# Patient Record
Sex: Male | Born: 1960 | Hispanic: No | Marital: Single | Smoking: Current every day smoker
Health system: Southern US, Community
[De-identification: ages and names within clinical notes are randomized; demographics above are authoritative.]

## PROBLEM LIST (undated history)

## (undated) DIAGNOSIS — F419 Anxiety disorder, unspecified: Secondary | ICD-10-CM

## (undated) DIAGNOSIS — R569 Unspecified convulsions: Secondary | ICD-10-CM

## (undated) DIAGNOSIS — E785 Hyperlipidemia, unspecified: Secondary | ICD-10-CM

## (undated) DIAGNOSIS — T7840XA Allergy, unspecified, initial encounter: Secondary | ICD-10-CM

## (undated) DIAGNOSIS — F32A Depression, unspecified: Secondary | ICD-10-CM

## (undated) HISTORY — DX: Hyperlipidemia, unspecified: E78.5

## (undated) HISTORY — DX: Allergy, unspecified, initial encounter: T78.40XA

## (undated) HISTORY — PX: HERNIA REPAIR: SHX51

## (undated) HISTORY — PX: OTHER SURGICAL HISTORY: SHX169

## (undated) HISTORY — DX: Unspecified convulsions: R56.9

## (undated) HISTORY — DX: Depression, unspecified: F32.A

## (undated) HISTORY — DX: Anxiety disorder, unspecified: F41.9

---

## 2017-10-05 LAB — HM HEPATITIS C SCREENING LAB: HM Hepatitis Screen: NEGATIVE

## 2017-12-25 LAB — HM COLONOSCOPY

## 2019-05-21 DIAGNOSIS — G4700 Insomnia, unspecified: Secondary | ICD-10-CM | POA: Insufficient documentation

## 2020-08-07 HISTORY — PX: CAROTID ENDARTERECTOMY: SUR193

## 2020-08-23 DIAGNOSIS — R29810 Facial weakness: Secondary | ICD-10-CM | POA: Insufficient documentation

## 2020-11-05 DIAGNOSIS — M5441 Lumbago with sciatica, right side: Secondary | ICD-10-CM | POA: Diagnosis not present

## 2021-06-28 DIAGNOSIS — J0140 Acute pansinusitis, unspecified: Secondary | ICD-10-CM | POA: Diagnosis not present

## 2021-12-23 ENCOUNTER — Ambulatory Visit: Payer: BC Managed Care – PPO | Admitting: Nurse Practitioner

## 2021-12-23 ENCOUNTER — Encounter: Payer: Self-pay | Admitting: Nurse Practitioner

## 2021-12-23 VITALS — BP 100/78 | HR 90 | Temp 97.6°F | Ht 69.5 in | Wt 179.2 lb

## 2021-12-23 DIAGNOSIS — Z125 Encounter for screening for malignant neoplasm of prostate: Secondary | ICD-10-CM

## 2021-12-23 DIAGNOSIS — Z0000 Encounter for general adult medical examination without abnormal findings: Secondary | ICD-10-CM | POA: Insufficient documentation

## 2021-12-23 DIAGNOSIS — E785 Hyperlipidemia, unspecified: Secondary | ICD-10-CM | POA: Diagnosis not present

## 2021-12-23 DIAGNOSIS — E559 Vitamin D deficiency, unspecified: Secondary | ICD-10-CM | POA: Insufficient documentation

## 2021-12-23 DIAGNOSIS — R5383 Other fatigue: Secondary | ICD-10-CM

## 2021-12-23 DIAGNOSIS — E663 Overweight: Secondary | ICD-10-CM

## 2021-12-23 DIAGNOSIS — F419 Anxiety disorder, unspecified: Secondary | ICD-10-CM | POA: Diagnosis not present

## 2021-12-23 DIAGNOSIS — I6522 Occlusion and stenosis of left carotid artery: Secondary | ICD-10-CM

## 2021-12-23 DIAGNOSIS — G250 Essential tremor: Secondary | ICD-10-CM | POA: Insufficient documentation

## 2021-12-23 DIAGNOSIS — G4700 Insomnia, unspecified: Secondary | ICD-10-CM | POA: Diagnosis not present

## 2021-12-23 DIAGNOSIS — C44321 Squamous cell carcinoma of skin of nose: Secondary | ICD-10-CM | POA: Insufficient documentation

## 2021-12-23 DIAGNOSIS — F32A Depression, unspecified: Secondary | ICD-10-CM

## 2021-12-23 NOTE — Assessment & Plan Note (Signed)
Patient states history of fluctuating vitamin D levels and vitamin D deficiency.  Pending lab result

## 2021-12-23 NOTE — Assessment & Plan Note (Signed)
Patient states he was on Lunesta 3 mg.  States he has not slept as well since being off of the medication.  States he was started on Lunesta 2 mg and titrated to 3 mg patient is interested in getting back on medication.  Pending lab results we will attempt to look up on PDMP

## 2021-12-23 NOTE — Assessment & Plan Note (Signed)
Patient has a history of left carotid endarterectomy for 80% plus blockage.

## 2021-12-23 NOTE — Assessment & Plan Note (Signed)
Ambiguous in nature.  We will check a battery labs patient does not have carotid bruits on exam

## 2021-12-23 NOTE — Assessment & Plan Note (Signed)
Unable to review electronic medical records as patient was out of state medical record release signed in office today patient used to see Dr. Alfonso Ramus in [place]

## 2021-12-23 NOTE — Assessment & Plan Note (Signed)
Used to be on atorvastatin 40 mg.  Has been out of medication for approximately 7 months.  Pending labs we will restart atorvastatin 40 mg

## 2021-12-23 NOTE — Assessment & Plan Note (Signed)
Patient used to be maintained on escitalopram 20 mg along with bupropion 150 mg.  Patient has not been taking medication for approximately 7 months.  Did administer PHQ-9 and GAD-7 in office.  Screenings were positive patient denies SI/HI/AVH.  He would like to hold off on taking any medication at the current moment as he feels like he is doing well since being off.

## 2021-12-23 NOTE — Patient Instructions (Signed)
Nice to see you today I will be in touch with the lab results once I have them Follow up in 3 months, sooner if you need me

## 2021-12-23 NOTE — Progress Notes (Signed)
Established Patient Office Visit  Subjective   Patient ID: Barry Perez, male    DOB: November 27, 1960  Age: 61 y.o. MRN: [id number]  Chief Complaint  Patient presents with   Establish Care    Loss of energy, weakness in body-exhaustion in afternoon. Shaky knees Carotid artery, possible anemia Has been out of meds for at least 7 months, has not been able to get them refilled, as has not been able to be seen     HPI  Fatigue:  past month or 6 weeks. States that most days and 12-2 daily he will feel weak and his legs feel week and feel sluggish. No pain. No passing out thus.  Left sided carotid en 08/2020  HLD: Tolerated atorvastatin and did well.  1-2 meals throughout the day. Will snack on beef jerky. Coffee (3-5 cups), water on occasionaly. Black coffee to 1`-2 16oz cokes  Allergies: has been on zyrtec and singular  Insomnia: States that he has been on lunesta '3mg'$ . Has been off medicatoin for approx 7 months. States that he has trouble getting to sleep but can stay asleep  Anxiety and depression: was on lexapro and welbutrin in the past. States that he is managing ok with the medicatoins. States that he is not sure if he want   Colonscopy and Endo: 2.5 years ago. Clean. Not sure in [place]  CPE 2 years ago    Review of Systems  Constitutional:  Positive for malaise/fatigue. Negative for chills and fever.  Respiratory:  Positive for shortness of breath.   Cardiovascular:  Negative for chest pain (muscluar) and leg swelling.  Gastrointestinal:  Positive for diarrhea (past 4 days). Negative for abdominal pain, nausea and vomiting.       BM daily  Genitourinary:  Negative for dysuria and hematuria.       Nocturia negative  Neurological:  Negative for tingling, weakness and headaches.  Psychiatric/Behavioral:  Negative for hallucinations and suicidal ideas.      Objective:     BP 100/78 (BP Location: Left Arm, Patient Position: Sitting, Cuff Size: Normal)   Pulse 90   Temp  97.6 F (36.4 C) (Temporal)   Ht 5' 9.5" (1.765 m)   Wt 179 lb 3.2 oz (81.3 kg)   SpO2 96%   BMI 26.08 kg/m    Physical Exam Vitals and nursing note reviewed.  Constitutional:      Appearance: Normal appearance.  HENT:     Right Ear: Tympanic membrane, ear canal and external ear normal.     Left Ear: Tympanic membrane, ear canal and external ear normal.     Mouth/Throat:     Mouth: Mucous membranes are moist.     Pharynx: Oropharynx is clear.  Eyes:     Extraocular Movements: Extraocular movements intact.     Pupils: Pupils are equal, round, and reactive to light.  Neck:     Vascular: No carotid bruit.  Cardiovascular:     Rate and Rhythm: Normal rate and regular rhythm.     Pulses: Normal pulses.     Heart sounds: Normal heart sounds.  Pulmonary:     Effort: Pulmonary effort is normal.     Breath sounds: Normal breath sounds.  Abdominal:     General: Bowel sounds are normal. There is no distension.     Palpations: There is no mass.     Tenderness: There is abdominal tenderness.     Hernia: No hernia is present.  Musculoskeletal:     Right lower  leg: No edema.     Left lower leg: No edema.  Lymphadenopathy:     Cervical: No cervical adenopathy.  Skin:    General: Skin is warm.  Neurological:     General: No focal deficit present.     Mental Status: He is alert.     Deep Tendon Reflexes:     Reflex Scores:      Bicep reflexes are 2+ on the right side and 2+ on the left side.      Patellar reflexes are 2+ on the right side and 2+ on the left side.    Comments: Bilateral upper and lower extremity strength 5/5  Psychiatric:        Mood and Affect: Mood normal.        Behavior: Behavior normal.        Thought Content: Thought content normal.        Judgment: Judgment normal.     No results found for any visits on 12/23/21.    The ASCVD Risk score (Arnett DK, et al., 2019) failed to calculate for the following reasons:   The valid total cholesterol range is  130 to 320 mg/dL   Unable to determine if patient is Non-Hispanic African American    Assessment & Plan:   Problem List Items Addressed This Visit       Cardiovascular and Mediastinum   Carotid stenosis, asymptomatic, left    Patient has a history of left carotid endarterectomy for 80% plus blockage.       Relevant Medications   atorvastatin (LIPITOR) 40 MG tablet   aspirin 81 MG chewable tablet     Other   Insomnia    Patient states he was on Lunesta 3 mg.  States he has not slept as well since being off of the medication.  States he was started on Lunesta 2 mg and titrated to 3 mg patient is interested in getting back on medication.  Pending lab results we will attempt to look up on PDMP       Relevant Orders   TSH   Anxiety and depression    Patient used to be maintained on escitalopram 20 mg along with bupropion 150 mg.  Patient has not been taking medication for approximately 7 months.  Did administer PHQ-9 and GAD-7 in office.  Screenings were positive patient denies SI/HI/AVH.  He would like to hold off on taking any medication at the current moment as he feels like he is doing well since being off.       Relevant Medications   escitalopram (LEXAPRO) 20 MG tablet   buPROPion (WELLBUTRIN XL) 150 MG 24 hr tablet   Other Relevant Orders   CBC   Comprehensive metabolic panel   Vitamin D deficiency - Primary    Patient states history of fluctuating vitamin D levels and vitamin D deficiency.  Pending lab result       Relevant Orders   VITAMIN D 25 Hydroxy (Vit-D Deficiency, Fractures)   Encounter for medical examination to establish care    Unable to review electronic medical records as patient was out of state medical record release signed in office today patient used to see Dr. Alfonso Ramus in [place]       Hyperlipidemia    Used to be on atorvastatin 40 mg.  Has been out of medication for approximately 7 months.  Pending labs we will restart atorvastatin 40 mg        Relevant Medications   atorvastatin (  LIPITOR) 40 MG tablet   aspirin 81 MG chewable tablet   Other Relevant Orders   CBC   Comprehensive metabolic panel   Lipid panel   Other fatigue    Ambiguous in nature.  We will check a battery labs patient does not have carotid bruits on exam       Relevant Orders   Vitamin B12   Hemoglobin A1c   TSH   Overweight   Relevant Orders   Hemoglobin A1c   Other Visit Diagnoses     Screening for prostate cancer       Relevant Orders   PSA       Return in about 3 months (around 03/25/2022) for Recheck.    Romilda Garret, NP

## 2021-12-24 LAB — COMPREHENSIVE METABOLIC PANEL
AG Ratio: 1.9 (calc) (ref 1.0–2.5)
ALT: 8 U/L — ABNORMAL LOW (ref 9–46)
AST: 12 U/L (ref 10–35)
Albumin: 4.1 g/dL (ref 3.6–5.1)
Alkaline phosphatase (APISO): 51 U/L (ref 35–144)
BUN: 17 mg/dL (ref 7–25)
CO2: 26 mmol/L (ref 20–32)
Calcium: 9 mg/dL (ref 8.6–10.3)
Chloride: 103 mmol/L (ref 98–110)
Creat: 1.25 mg/dL (ref 0.70–1.35)
Globulin: 2.2 g/dL (calc) (ref 1.9–3.7)
Glucose, Bld: 85 mg/dL (ref 65–99)
Potassium: 4.5 mmol/L (ref 3.5–5.3)
Sodium: 139 mmol/L (ref 135–146)
Total Bilirubin: 0.5 mg/dL (ref 0.2–1.2)
Total Protein: 6.3 g/dL (ref 6.1–8.1)

## 2021-12-24 LAB — LIPID PANEL
Cholesterol: 201 mg/dL — ABNORMAL HIGH (ref <200)
HDL: 59 mg/dL (ref >=40)
LDL Cholesterol (Calc): 121 mg/dL (calc) — ABNORMAL HIGH
Non-HDL Cholesterol (Calc): 142 mg/dL (calc) — ABNORMAL HIGH (ref <130)
Total CHOL/HDL Ratio: 3.4 (calc) (ref <5.0)
Triglycerides: 99 mg/dL (ref <150)

## 2021-12-24 LAB — HEMOGLOBIN A1C
Hgb A1c MFr Bld: 5.2 % of total Hgb (ref <5.7)
Mean Plasma Glucose: 103 mg/dL
eAG (mmol/L): 5.7 mmol/L

## 2021-12-24 LAB — CBC
HCT: 44.8 % (ref 38.5–50.0)
Hemoglobin: 14.8 g/dL (ref 13.2–17.1)
MCH: 30.1 pg (ref 27.0–33.0)
MCHC: 33 g/dL (ref 32.0–36.0)
MCV: 91.1 fL (ref 80.0–100.0)
MPV: 10.4 fL (ref 7.5–12.5)
Platelets: 238 10*3/uL (ref 140–400)
RBC: 4.92 10*6/uL (ref 4.20–5.80)
RDW: 12.1 % (ref 11.0–15.0)
WBC: 8.7 10*3/uL (ref 3.8–10.8)

## 2021-12-24 LAB — PSA: PSA: 0.78 ng/mL (ref <=4.00)

## 2021-12-24 LAB — VITAMIN D 25 HYDROXY (VIT D DEFICIENCY, FRACTURES): Vit D, 25-Hydroxy: 19 ng/mL — ABNORMAL LOW (ref 30–100)

## 2021-12-24 LAB — VITAMIN B12: Vitamin B-12: 302 pg/mL (ref 200–1100)

## 2021-12-24 LAB — TSH: TSH: 2.84 mIU/L (ref 0.40–4.50)

## 2021-12-26 ENCOUNTER — Other Ambulatory Visit: Payer: Self-pay | Admitting: Nurse Practitioner

## 2021-12-26 DIAGNOSIS — G4700 Insomnia, unspecified: Secondary | ICD-10-CM

## 2021-12-26 DIAGNOSIS — E559 Vitamin D deficiency, unspecified: Secondary | ICD-10-CM

## 2021-12-26 DIAGNOSIS — E785 Hyperlipidemia, unspecified: Secondary | ICD-10-CM

## 2021-12-26 MED ORDER — ATORVASTATIN CALCIUM 20 MG PO TABS: 20.0000 mg | ORAL_TABLET | Freq: Every day | ORAL | 1 refills | Status: DC

## 2021-12-26 MED ORDER — ESZOPICLONE 2 MG PO TABS: 2.0000 mg | ORAL_TABLET | Freq: Every evening | ORAL | 0 refills | Status: DC | PRN

## 2021-12-26 MED ORDER — VITAMIN D (ERGOCALCIFEROL) 1.25 MG (50000 UNIT) PO CAPS: 50000.0000 [IU] | ORAL_CAPSULE | ORAL | 0 refills | Status: DC

## 2022-01-16 ENCOUNTER — Ambulatory Visit (INDEPENDENT_AMBULATORY_CARE_PROVIDER_SITE_OTHER): Payer: BC Managed Care – PPO

## 2022-01-16 ENCOUNTER — Ambulatory Visit (INDEPENDENT_AMBULATORY_CARE_PROVIDER_SITE_OTHER): Payer: BC Managed Care – PPO | Admitting: Orthopaedic Surgery

## 2022-01-16 DIAGNOSIS — M79641 Pain in right hand: Secondary | ICD-10-CM | POA: Diagnosis not present

## 2022-01-16 DIAGNOSIS — S63641A Sprain of metacarpophalangeal joint of right thumb, initial encounter: Secondary | ICD-10-CM

## 2022-01-16 NOTE — Progress Notes (Signed)
Chief Complaint: Right thumb pain     History of Present Illness:    Barry Perez is a 61 y.o. male right-hand-dominant male presents with right MCP thumb pain for approximately 10 years.  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  His goal is hopefully to get back to a point where he is able to bowl a perfect game.  He states that for the last 10 years he has had soreness about the right first MCP of the thumb.  To this effect he has had to limit his ability to place his thumb completely in the ball.  He has trialed different methods of modification with taping and bracing.  This does not seem to alleviate his issues.  He has noticed some weakness in terms of not being able to place the right hand in the ball.    Surgical History:   None  PMH/PSH/Family History/Social History/Meds/Allergies:    Past Medical History:  Diagnosis Date   Anxiety    Depression    Hyperlipidemia    Seizures (HCC)     Social History   Socioeconomic History   Marital status: Single    Spouse name: Not on file   Number of children: 2   Years of education: Not on file   Highest education level: Not on file  Occupational History   Not on file  Tobacco Use   Smoking status: Every Day    Packs/day: 1.00    Years: 23.00    Total pack years: 23.00    Types: Cigarettes   Smokeless tobacco: Never  Vaping Use   Vaping Use: Never used  Substance and Sexual Activity   Alcohol use: Yes    Comment: liqour 1-2 with ice a night   Drug use: Not Currently   Sexual activity: Not on file  Other Topics Concern   Not on file  Social History Narrative   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   Social Determinants of Health   Financial Resource Strain: Not on file  Food Insecurity: Not on file  Transportation Needs: Not on file  Physical Activity: Not on file  Stress: Not on file  Social Connections: Not on file   Family History  Problem Relation Age of Onset    Hypertension Mother    Hyperlipidemia Mother    Diabetes Mother    COPD Father    Hyperlipidemia Father    Hypertension Father    Allergies  Allergen Reactions   Erythromycin Nausea And Vomiting and Other (See Comments)   Current Outpatient Medications  Medication Sig Dispense Refill   aspirin 81 MG chewable tablet Chew 81 mg by mouth daily.     atorvastatin (LIPITOR) 20 MG tablet Take 1 tablet (20 mg total) by mouth daily. 90 tablet 1   buPROPion (WELLBUTRIN XL) 150 MG 24 hr tablet Take 1 tablet by mouth daily.     escitalopram (LEXAPRO) 20 MG tablet Take 20 mg by mouth daily.     eszopiclone (LUNESTA) 2 MG TABS tablet Take 1 tablet (2 mg total) by mouth at bedtime as needed for sleep. Take immediately before bedtime 30 tablet 0   montelukast (SINGULAIR) 10 MG tablet Take 10 mg by mouth daily.     Vitamin D, Ergocalciferol, (DRISDOL) 1.25 MG (50000 UNIT) CAPS capsule Take 1  capsule (50,000 Units total) by mouth every 7 (seven) days. 12 capsule 0   No current facility-administered medications for this visit.   No results found.  Review of Systems:   A ROS was performed including pertinent positives and negatives as documented in the HPI.  Physical Exam :   Constitutional: NAD and appears stated age Neurological: Alert and oriented Psych: Appropriate affect and cooperative There were no vitals taken for this visit.   Comprehensive Musculoskeletal Exam:    He has tenderness palpation about right thumb UCL with a lesion that appears and feels like to extend this lesion.  No loss of sensation in the thumb there is normal sensation in all distributions of the hand.  He does have some soreness with an opening valgus stress of the UCL.  This does not open grossly.  Remainder of neurosensory exam of the hand is intact.  Imaging:   Xray (3 views right hand): Normal   I personally reviewed and interpreted the radiographs.   Assessment:   60 y.o. male with a right first MCP  likely chronic UCL strain versus tearing.  Given the fact that he is a competitive bowler is hoping to be able to continue this, I do believe that it would be less likely that hand therapy would be able to get him strong enough to achieve his goals.  He does have some findings on examination consistent with a chronic UCL injury which I believe an MRI would be helpful with the diagnosis.  He has been living with his for 10 years and has not been able to modify or adjust with any type of taping or bracing to allow him to return to his desired level support.  That effect we will plan for an MRI of the right thumb and we will proceed from there.  Plan :    -Plan for MRI right and reassessment to discuss after     I personally saw and evaluated the patient, and participated in the management and treatment plan.  Vanetta Mulders, MD Attending Physician, Orthopedic Surgery  This document was dictated using Dragon voice recognition software. A reasonable attempt at proof reading has been made to minimize errors.

## 2022-01-24 ENCOUNTER — Other Ambulatory Visit (HOSPITAL_BASED_OUTPATIENT_CLINIC_OR_DEPARTMENT_OTHER): Payer: Self-pay | Admitting: Orthopaedic Surgery

## 2022-01-24 ENCOUNTER — Ambulatory Visit
Admission: RE | Admit: 2022-01-24 | Discharge: 2022-01-24 | Disposition: A | Discharge status: Home / Self Care | Payer: BC Managed Care – PPO | Source: Ambulatory Visit | Attending: Orthopaedic Surgery | Admitting: Orthopaedic Surgery

## 2022-01-24 DIAGNOSIS — S63641A Sprain of metacarpophalangeal joint of right thumb, initial encounter: Secondary | ICD-10-CM

## 2022-01-24 DIAGNOSIS — S53441A Ulnar collateral ligament sprain of right elbow, initial encounter: Secondary | ICD-10-CM | POA: Diagnosis not present

## 2022-01-25 ENCOUNTER — Other Ambulatory Visit (HOSPITAL_BASED_OUTPATIENT_CLINIC_OR_DEPARTMENT_OTHER): Payer: Self-pay | Admitting: Orthopaedic Surgery

## 2022-01-25 DIAGNOSIS — S63641A Sprain of metacarpophalangeal joint of right thumb, initial encounter: Secondary | ICD-10-CM

## 2022-02-13 ENCOUNTER — Ambulatory Visit (HOSPITAL_BASED_OUTPATIENT_CLINIC_OR_DEPARTMENT_OTHER): Payer: BC Managed Care – PPO | Admitting: Orthopaedic Surgery

## 2022-02-13 ENCOUNTER — Other Ambulatory Visit (HOSPITAL_BASED_OUTPATIENT_CLINIC_OR_DEPARTMENT_OTHER): Payer: Self-pay | Admitting: Orthopaedic Surgery

## 2022-02-13 DIAGNOSIS — S63641A Sprain of metacarpophalangeal joint of right thumb, initial encounter: Secondary | ICD-10-CM

## 2022-02-21 ENCOUNTER — Ambulatory Visit (INDEPENDENT_AMBULATORY_CARE_PROVIDER_SITE_OTHER): Payer: BC Managed Care – PPO | Admitting: Orthopedic Surgery

## 2022-02-21 DIAGNOSIS — S6431XA Injury of digital nerve of right thumb, initial encounter: Secondary | ICD-10-CM

## 2022-02-21 NOTE — Progress Notes (Signed)
Office Visit Note   Patient: Barry Perez           Date of Birth: [birth year]           MRN: [id number] Visit Date: 02/21/2022              Requested by: Vanetta Mulders, MD [address] [place],   [id number] PCP: Michela Pitcher, NP   Assessment & Plan: Visit Diagnoses:  1. 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  He is tender to palpation directly over a mobile, subcutaneous mass at the ulnar aspect of the thumb just distal to the MCP joint which seems like the ulnar digital nerve.  He has no pain at the thumb UCL and no MCP instability.  We discussed the nature of bowler's thumb and both conservative versus surgical treatment options.   We discussed bracing, hand therapy, use of a neoprene sleeve and bowling, and observation versus neurolysis of the ulnar digital nerve.  We discussed that the expected postoperative recovery period would be around 4 to 6 weeks.  Because bowling season starts at the beginning of August, he would miss a portion of the season.  He is unwilling to do this I would like to see how he does this season with conservative treatment.  He can follow-up again as needed.  Follow-Up Instructions: No follow-ups on file.   Orders:  No orders of the defined types were placed in this encounter.  No orders of the defined types were placed in this encounter.     Procedures: No procedures performed   Clinical Data: No additional findings.   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  For the last  several years he describes pain at the ulnar aspect of the thumb near the MCP joint.  He only has pain with release of the bowling ball as the edge of the hole rubs against a firm, round, mobile mass in the area.  He has no pain at rest or during his normal daily activities that do not involve bowling.  His pain can be 8-9/10 when releasing the bowling ball.  He denies any significant numbness in the thumb.  He has previously used some tape around the thumb but this has bunched up and been difficult to do effectively during the season.    Review of Systems   Objective: Vital Signs: There were no vitals taken for this visit.  Physical Exam Constitutional:      Appearance: Normal appearance.  Cardiovascular:     Rate and Rhythm: Normal rate.     Pulses: Normal pulses.  Pulmonary:     Effort: Pulmonary effort is normal.  Skin:    General: Skin is warm and dry.     Capillary Refill: Capillary refill takes less than 2 seconds.  Neurological:     Mental Status: He is alert.     Right Hand Exam   Tenderness  The patient is experiencing no tenderness.   Range of Motion  The patient has  normal right wrist ROM.   Other  Erythema: absent Sensation: normal Pulse: present  Comments:  Firm, round, mobile mass at volar ulnar aspect of thumb just distal to MCP joint.  Mass seems approx 0.5 cm in diameter.  Negative Tinel over mass. No TTP at [facility] and no instability with radial/ulnar stress at MCPJ.       Specialty Comments:  No specialty comments available.  Imaging: No results found.   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 12/23/2021   Anxiety and depression 12/23/2021   Carotid stenosis, asymptomatic, left 12/23/2021   Vitamin D deficiency 12/23/2021   Encounter for medical examination to establish care 12/23/2021   Hyperlipidemia  12/23/2021   Other fatigue 12/23/2021   Overweight 12/23/2021   Facial droop 08/23/2020   Insomnia 05/21/2019   Past Medical History:  Diagnosis Date   Anxiety    Depression    Hyperlipidemia    Seizures ([place])     Family History  Problem Relation Age of Onset   Hypertension Mother    Hyperlipidemia Mother    Diabetes Mother    COPD Father    Hyperlipidemia Father    Hypertension Father      Social History   Occupational History   Not on file  Tobacco Use   Smoking status: Every Day    Packs/day: 1.00    Years: 23.00    Total pack years: 23.00    Types: Cigarettes   Smokeless tobacco: Never  Vaping Use   Vaping Use: Never used  Substance and Sexual Activity   Alcohol use: Yes    Comment: liqour 1-2 with ice a night   Drug use: Not Currently   Sexual activity: Not on file

## 2022-03-22 ENCOUNTER — Other Ambulatory Visit: Payer: Self-pay | Admitting: Nurse Practitioner

## 2022-03-22 DIAGNOSIS — E559 Vitamin D deficiency, unspecified: Secondary | ICD-10-CM

## 2022-03-26 ENCOUNTER — Other Ambulatory Visit: Payer: Self-pay | Admitting: Nurse Practitioner

## 2022-03-26 DIAGNOSIS — E559 Vitamin D deficiency, unspecified: Secondary | ICD-10-CM

## 2022-03-29 ENCOUNTER — Ambulatory Visit: Payer: BC Managed Care – PPO | Admitting: Nurse Practitioner

## 2022-03-29 VITALS — BP 118/76 | HR 71 | Temp 97.5°F | Resp 16 | Ht 69.5 in | Wt 179.5 lb

## 2022-03-29 DIAGNOSIS — Z760 Encounter for issue of repeat prescription: Secondary | ICD-10-CM

## 2022-03-29 DIAGNOSIS — E559 Vitamin D deficiency, unspecified: Secondary | ICD-10-CM | POA: Diagnosis not present

## 2022-03-29 DIAGNOSIS — R1084 Generalized abdominal pain: Secondary | ICD-10-CM

## 2022-03-29 DIAGNOSIS — G4700 Insomnia, unspecified: Secondary | ICD-10-CM | POA: Diagnosis not present

## 2022-03-29 DIAGNOSIS — R5383 Other fatigue: Secondary | ICD-10-CM

## 2022-03-29 DIAGNOSIS — F32A Depression, unspecified: Secondary | ICD-10-CM | POA: Diagnosis not present

## 2022-03-29 DIAGNOSIS — F419 Anxiety disorder, unspecified: Secondary | ICD-10-CM | POA: Diagnosis not present

## 2022-03-29 MED ORDER — MONTELUKAST SODIUM 10 MG PO TABS: 10.0000 mg | ORAL_TABLET | Freq: Every day | ORAL | 1 refills | Status: DC

## 2022-03-29 MED ORDER — ESCITALOPRAM OXALATE 10 MG PO TABS: ORAL_TABLET | ORAL | 0 refills | Status: DC

## 2022-03-29 MED ORDER — BUPROPION HCL ER (XL) 150 MG PO TB24: 150.0000 mg | ORAL_TABLET | Freq: Every day | ORAL | 1 refills | Status: DC

## 2022-03-29 MED ORDER — ESZOPICLONE 3 MG PO TABS: 3.0000 mg | ORAL_TABLET | Freq: Every day | ORAL | 0 refills | Status: DC

## 2022-03-29 NOTE — Assessment & Plan Note (Signed)
States the lunesta 2 mg was not quite as effective as the '3mg'$ . Titrate patient up to lunesta '3mg'$ 

## 2022-03-29 NOTE — Assessment & Plan Note (Signed)
Persistent. Will treat MDD and GAD if fatigue does not improve will need to pursue a sleep study

## 2022-03-29 NOTE — Patient Instructions (Signed)
Nice to see you today Give the probiotics 2 weeks to see if you notice a difference. If not message me on mychart Follow up with me in 2 months to recheck your fatigue, sooner if you need me

## 2022-03-29 NOTE — Assessment & Plan Note (Signed)
Patient still working on his prescription vitamin D. He will transition to vitamin D 2000 IU once daily after. This was written on AVS by hand after printing

## 2022-03-29 NOTE — Assessment & Plan Note (Signed)
PHQ 9 and GAD 7 administered in office. Patient feels like he should start back on his anti depressants. We will start lexapro '10mg'$  for 2 weeks then start '20mg'$  there after. Will start back wellbutrin '150mg'$  daily. Denies HI/Si/AVH

## 2022-03-29 NOTE — Progress Notes (Signed)
Established Patient Office Visit  Subjective   Patient ID: Barry Perez, male    DOB: 24-Mar-1961  Age: 61 y.o. MRN: [id number]  Chief Complaint  Patient presents with   Follow-up    3 months-started on Atorvastatin and Lunesta.   Fatigue    Patient states fatigue is still an issue, to the point of having hard time finishing his routine work through out the week.   Medication Management    Would like to discuss starting back on Lexapro and Bupropion    HPI  Fatigue: States that he is still having a great amount of fatigue. States brian fog, weak legs, tiredness. States that Johnnye Sima is working some. States that taking naps during the day due to the amount of fatigue and  also has difficulty do a whole days worth of work He has been replacing his vitamin D but is still taking the prescription that was called in last office visit. He has not has not had a sleep study in the past   Sleeping:  States that 7-8 hours of sleep at night if not more goes to bed 10pm and will get up around 7-730  MDD/GAD: has been maintained on lexapro '20mg'$  and wellbutrin 150. At last office visit he felt ok with out medication States that he feels he needs to go back on the medication. He is also having an increase in fatigue even after working on vitamin D replacement   Abdominal discomfort: states that he is having a cramping pain in the RUQ and the LUQ that is intermittent. States that he did have pleurisy when he was a teenager and he compares this discomfort to that. States that he is having at least 1 "good BM" a day and then 1-2 smaller ones that are gas and smaller stool amounts. He just started to take a probiotic today      03/29/2022    5:34 PM 12/23/2021    4:12 PM  PHQ9 SCORE ONLY  PHQ-9 Total Score 21 14       03/29/2022    5:34 PM 12/23/2021    4:13 PM  GAD 7 : Generalized Anxiety Score  Nervous, Anxious, on Edge 0 1  Control/stop worrying 3 1  Worry too much - different things 3 2   Trouble relaxing 2 0  Restless 0 0  Easily annoyed or irritable 1 1  Afraid - awful might happen 1 1  Total GAD 7 Score 10 6  Anxiety Difficulty Very difficult Somewhat difficult        Review of Systems  Constitutional:  Positive for malaise/fatigue. Negative for chills and fever.  Gastrointestinal:  Positive for abdominal pain. Negative for nausea and vomiting.       Lots of gas  Small amounts 2-3 times a day  Neurological:  Negative for headaches.  Psychiatric/Behavioral:  Negative for hallucinations and suicidal ideas.       Objective:     BP 118/76   Pulse 71   Temp (!) 97.5 F (36.4 C)   Resp 16   Ht 5' 9.5" (1.765 m)   Wt 179 lb 8 oz (81.4 kg)   SpO2 96%   BMI 26.13 kg/m    Physical Exam Vitals and nursing note reviewed.  Constitutional:      Appearance: Normal appearance.  Neck:     Vascular: No carotid bruit.  Cardiovascular:     Rate and Rhythm: Normal rate and regular rhythm.     Heart sounds: Normal  heart sounds.  Pulmonary:     Effort: Pulmonary effort is normal.     Breath sounds: Normal breath sounds.  Abdominal:     General: Bowel sounds are normal. There is no distension.     Palpations: There is no mass.     Tenderness: There is generalized abdominal tenderness.     Hernia: No hernia is present.     Comments: Negative markle sign  Neurological:     Mental Status: He is alert.  Psychiatric:        Mood and Affect: Mood normal.        Behavior: Behavior normal.        Thought Content: Thought content normal.        Judgment: Judgment normal.      No results found for any visits on 03/29/22.    The ASCVD Risk score (Arnett DK, et al., 2019) failed to calculate for the following reasons:   Unable to determine if patient is Non-Hispanic African American    Assessment & Plan:   Problem List Items Addressed This Visit       Other   Insomnia    States the lunesta 2 mg was not quite as effective as the '3mg'$ . Titrate patient up  to lunesta '3mg'$       Relevant Medications   eszopiclone 3 MG TABS   Anxiety and depression - Primary    PHQ 9 and GAD 7 administered in office. Patient feels like he should start back on his anti depressants. We will start lexapro '10mg'$  for 2 weeks then start '20mg'$  there after. Will start back wellbutrin '150mg'$  daily. Denies HI/Si/AVH      Relevant Medications   buPROPion (WELLBUTRIN XL) 150 MG 24 hr tablet   escitalopram (LEXAPRO) 10 MG tablet   Vitamin D deficiency    Patient still working on his prescription vitamin D. He will transition to vitamin D 2000 IU once daily after. This was written on AVS by hand after printing       Other fatigue    Persistent. Will treat MDD and GAD if fatigue does not improve will need to pursue a sleep study      Generalized abdominal pain    Ambiguus in nature. Offered to obtain labs and abdominal xray politely declined. Continue the probiotic and check in via mychart in 2 weeks to see if improvement       Other Visit Diagnoses     Medication refill       Relevant Medications   montelukast (SINGULAIR) 10 MG tablet       Return in about 2 months (around 05/29/2022) for Recheck on fatigue and MDD/GAD.    Romilda Garret, NP

## 2022-03-29 NOTE — Assessment & Plan Note (Signed)
Ambiguus in nature. Offered to obtain labs and abdominal xray politely declined. Continue the probiotic and check in via mychart in 2 weeks to see if improvement

## 2022-04-03 DIAGNOSIS — Z85828 Personal history of other malignant neoplasm of skin: Secondary | ICD-10-CM | POA: Diagnosis not present

## 2022-04-03 DIAGNOSIS — R208 Other disturbances of skin sensation: Secondary | ICD-10-CM | POA: Diagnosis not present

## 2022-04-03 DIAGNOSIS — D225 Melanocytic nevi of trunk: Secondary | ICD-10-CM | POA: Diagnosis not present

## 2022-04-03 DIAGNOSIS — X32XXXA Exposure to sunlight, initial encounter: Secondary | ICD-10-CM | POA: Diagnosis not present

## 2022-04-03 DIAGNOSIS — L918 Other hypertrophic disorders of the skin: Secondary | ICD-10-CM | POA: Diagnosis not present

## 2022-04-03 DIAGNOSIS — L858 Other specified epidermal thickening: Secondary | ICD-10-CM | POA: Diagnosis not present

## 2022-04-03 DIAGNOSIS — D2261 Melanocytic nevi of right upper limb, including shoulder: Secondary | ICD-10-CM | POA: Diagnosis not present

## 2022-04-03 DIAGNOSIS — D485 Neoplasm of uncertain behavior of skin: Secondary | ICD-10-CM | POA: Diagnosis not present

## 2022-04-03 DIAGNOSIS — L570 Actinic keratosis: Secondary | ICD-10-CM | POA: Diagnosis not present

## 2022-04-03 DIAGNOSIS — D2262 Melanocytic nevi of left upper limb, including shoulder: Secondary | ICD-10-CM | POA: Diagnosis not present

## 2022-04-18 ENCOUNTER — Encounter: Payer: Self-pay | Admitting: Nurse Practitioner

## 2022-04-18 DIAGNOSIS — F32A Depression, unspecified: Secondary | ICD-10-CM

## 2022-04-24 ENCOUNTER — Other Ambulatory Visit: Payer: Self-pay | Admitting: Nurse Practitioner

## 2022-04-24 DIAGNOSIS — F419 Anxiety disorder, unspecified: Secondary | ICD-10-CM

## 2022-04-24 DIAGNOSIS — F32A Depression, unspecified: Secondary | ICD-10-CM

## 2022-04-26 NOTE — Telephone Encounter (Signed)
Please inquire as patient on 20 mg at current of his Lexapro or he still at 10 mg?  I will refill accordingly

## 2022-04-26 NOTE — Telephone Encounter (Signed)
Patient states he has been taking 1 tablet once daily, misinterpreted the directions and did not increase to 2 tablets daily. He is waiting for the 20 mg dose. No side effects with 1 tablet once daily so far.

## 2022-05-10 ENCOUNTER — Encounter: Payer: Self-pay | Admitting: Clinical

## 2022-05-10 ENCOUNTER — Ambulatory Visit (INDEPENDENT_AMBULATORY_CARE_PROVIDER_SITE_OTHER): Payer: BC Managed Care – PPO | Admitting: Clinical

## 2022-05-10 DIAGNOSIS — F330 Major depressive disorder, recurrent, mild: Secondary | ICD-10-CM | POA: Diagnosis not present
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
## 2022-05-10 NOTE — Progress Notes (Signed)
                Joeli Fenner, LCSW 

## 2022-05-21 ENCOUNTER — Other Ambulatory Visit: Payer: Self-pay | Admitting: Nurse Practitioner

## 2022-05-21 DIAGNOSIS — G4700 Insomnia, unspecified: Secondary | ICD-10-CM

## 2022-05-23 MED ORDER — ESZOPICLONE 3 MG PO TABS: 3.0000 mg | ORAL_TABLET | Freq: Every day | ORAL | 0 refills | Status: DC

## 2022-05-25 ENCOUNTER — Ambulatory Visit: Payer: BC Managed Care – PPO | Admitting: Clinical

## 2022-05-25 DIAGNOSIS — F330 Major depressive disorder, recurrent, mild: Secondary | ICD-10-CM | POA: Diagnosis not present
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
## 2022-06-01 ENCOUNTER — Other Ambulatory Visit: Payer: Self-pay | Admitting: Nurse Practitioner

## 2022-06-01 DIAGNOSIS — E785 Hyperlipidemia, unspecified: Secondary | ICD-10-CM

## 2022-06-02 ENCOUNTER — Ambulatory Visit: Payer: BC Managed Care – PPO | Admitting: Nurse Practitioner

## 2022-06-02 VITALS — BP 108/62 | HR 75 | Temp 97.1°F | Resp 14 | Ht 69.5 in | Wt 173.2 lb

## 2022-06-02 DIAGNOSIS — E559 Vitamin D deficiency, unspecified: Secondary | ICD-10-CM

## 2022-06-02 DIAGNOSIS — R5383 Other fatigue: Secondary | ICD-10-CM | POA: Diagnosis not present

## 2022-06-02 DIAGNOSIS — G4700 Insomnia, unspecified: Secondary | ICD-10-CM | POA: Diagnosis not present

## 2022-06-02 DIAGNOSIS — F32A Depression, unspecified: Secondary | ICD-10-CM

## 2022-06-02 DIAGNOSIS — F419 Anxiety disorder, unspecified: Secondary | ICD-10-CM

## 2022-06-02 MED ORDER — ESZOPICLONE 3 MG PO TABS: 3.0000 mg | ORAL_TABLET | Freq: Every day | ORAL | 0 refills | Status: AC

## 2022-06-02 NOTE — Assessment & Plan Note (Signed)
Patient was repleted with prescription.  He has finished it.  Did want to check vitamin D today.  Patient deferred states we will do it next office visit.  Courage patient to get vitamin D 1000 IUs over-the-counter take 1 daily.

## 2022-06-02 NOTE — Progress Notes (Signed)
Established Patient Office Visit  Subjective   Patient ID: Barry Perez, male    DOB: 1961-04-20  Age: 61 y.o. MRN: [id number]  Chief Complaint  Patient presents with   Fatigue    Follow up- feels about the same    MDD/GAD    Follow up-sx better since been on medication. Is talking to therapist and was referred to psychiatrist but not sure who it is.    HPI   Fatigue: Has not been able to get lunesta. States was getting 8-9 now getting 6.5-7 hours. Feels sluggish most day.   MDD/GAD: States that he has felt like it has leveled him off. No lows. States that he is talking to   Insomnia:u States that he has not had to get any of the 3 mg at all     06/02/2022    4:38 PM 03/29/2022    5:34 PM 12/23/2021    4:13 PM  GAD 7 : Generalized Anxiety Score  Nervous, Anxious, on Edge 1 0 1  Control/stop worrying '3 3 1  '$ Worry too much - different things '3 3 2  '$ Trouble relaxing 2 2 0  Restless 0 0 0  Easily annoyed or irritable '1 1 1  '$ Afraid - awful might happen '1 1 1  '$ Total GAD 7 Score '11 10 6  '$ Anxiety Difficulty Somewhat difficult Very difficult Somewhat difficult         06/02/2022    4:36 PM 03/29/2022    5:34 PM 12/23/2021    4:12 PM  PHQ9 SCORE ONLY  PHQ-9 Total Score '13 21 14      '$ Review of Systems  Constitutional:  Negative for chills and fever.  Cardiovascular:  Negative for chest pain.  Psychiatric/Behavioral:  Negative for hallucinations and suicidal ideas.       Objective:     BP 108/62   Pulse 75   Temp (!) 97.1 F (36.2 C) (Temporal)   Resp 14   Ht 5' 9.5" (1.765 m)   Wt 173 lb 4 oz (78.6 kg)   SpO2 95%   BMI 25.22 kg/m    Physical Exam Vitals and nursing note reviewed.  Constitutional:      Appearance: Normal appearance.  Cardiovascular:     Rate and Rhythm: Normal rate and regular rhythm.     Heart sounds: Normal heart sounds.  Pulmonary:     Effort: Pulmonary effort is normal.     Breath sounds: Normal breath sounds.  Abdominal:      General: Bowel sounds are normal.  Lymphadenopathy:     Cervical: No cervical adenopathy.  Neurological:     Mental Status: He is alert.      No results found for any visits on 06/02/22.    The ASCVD Risk score (Arnett DK, et al., 2019) failed to calculate for the following reasons:   Unable to determine if patient is Non-Hispanic African American    Assessment & Plan:   Problem List Items Addressed This Visit       Other   Insomnia    Has not been able to get Lunesta 3 mg.  Prescription was sent to pharmacy but unable to pick up patient has plan limitations.  Recent to pharmacy good Rx coupon given in office today.      Relevant Medications   eszopiclone 3 MG TABS   Anxiety and depression - Primary    Currently maintained on Prozac and bupropion.  Patient has had some improvement in symptoms but  not fully where he thinks he should be.  He is speaking to a therapist.  He does have an appointment coming up Monday with psychiatry per his report.  We will relinquish management available at that point forward he can also take over Lunesta if they would like      Vitamin D deficiency    Patient was repleted with prescription.  He has finished it.  Did want to check vitamin D today.  Patient deferred states we will do it next office visit.  Courage patient to get vitamin D 1000 IUs over-the-counter take 1 daily.      Other fatigue    Patient is still feeling fatigued.  He still declines sleep study at this juncture.       Return in about 6 months (around 12/02/2022) for CPE and labs.    Romilda Garret, NP

## 2022-06-02 NOTE — Assessment & Plan Note (Signed)
Has not been able to get Lunesta 3 mg.  Prescription was sent to pharmacy but unable to pick up patient has plan limitations.  Recent to pharmacy good Rx coupon given in office today.

## 2022-06-02 NOTE — Patient Instructions (Addendum)
Nice to see you today Let me know how the psychiatry appointment goes Follow up with me in 6 months for physical and labs, sooner if you need me If you decide that you want to do a sleep study reach out and let me know Start taking vitamin D 1,000IU over the counter once a day

## 2022-06-02 NOTE — Assessment & Plan Note (Signed)
Patient is still feeling fatigued.  He still declines sleep study at this juncture.

## 2022-06-02 NOTE — Assessment & Plan Note (Signed)
Currently maintained on Prozac and bupropion.  Patient has had some improvement in symptoms but not fully where he thinks he should be.  He is speaking to a therapist.  He does have an appointment coming up Monday with psychiatry per his report.  We will relinquish management available at that point forward he can also take over Lunesta if they would like

## 2022-06-05 DIAGNOSIS — F331 Major depressive disorder, recurrent, moderate: Secondary | ICD-10-CM | POA: Diagnosis not present

## 2022-06-05 DIAGNOSIS — F411 Generalized anxiety disorder: Secondary | ICD-10-CM | POA: Diagnosis not present

## 2022-06-19 DIAGNOSIS — F331 Major depressive disorder, recurrent, moderate: Secondary | ICD-10-CM | POA: Diagnosis not present

## 2022-06-19 DIAGNOSIS — F411 Generalized anxiety disorder: Secondary | ICD-10-CM | POA: Diagnosis not present

## 2022-06-21 ENCOUNTER — Ambulatory Visit: Payer: BC Managed Care – PPO | Admitting: Clinical

## 2022-06-21 DIAGNOSIS — F411 Generalized anxiety disorder: Secondary | ICD-10-CM

## 2022-06-21 DIAGNOSIS — F330 Major depressive disorder, recurrent, mild: Secondary | ICD-10-CM | POA: Diagnosis not present

## 2022-06-21 NOTE — Progress Notes (Signed)
[facility] Counselor/Therapist Progress Note  Patient ID: Barry Perez, MRN: [id number]    Date: 06/21/22  Time Spent: 8:34  am - 9:25 am : 51 Minutes  Treatment Type: Individual Therapy.  Reported Symptoms: Patient reported fatigue, worry  Mental Status Exam: Appearance:  Well Groomed     Behavior: Appropriate  Motor: Normal  Speech/Language:  Clear and Coherent  Affect: Appropriate  Mood: Patient stated, "blah" mood  Thought process: normal  Thought content:   WNL  Sensory/Perceptual disturbances:   WNL  Orientation: oriented to person, place, and situation  Attention: Good  Concentration: Good  Memory: WNL  Fund of knowledge:  Good  Insight:   Good  Judgment:  Good  Impulse Control: Good   Risk Assessment: Danger to Self:  No Patient denied current suicidal ideation Self-injurious Behavior: No Danger to Others: No Patient denied current homicidal ideation Duty to Warn:no Physical Aggression / Violence:No  Access to Firearms a concern: No  Gang Involvement:No   Subjective:  Patient reported a change in medications 2 and 1/2 weeks ago and reported he is currently taking Prozac. Patient stated, "maybe a little" in regards to changes in symptoms since recent change in medication. Patient reported he continues to experience a lack of energy. Patient stated, "lots of bad news" since last session. Patient reported his son recently lost a friend, patient's friend's cancer has relapsed, and a family member has a tumor that has increased in size. Patient reported he continues to experience difficulty communicating with his son. Patient reported concern that his son has been home from college for 4 months and is sleeping until late in the afternoon and playing video games. Patient reported he worries about finances. Patient reported consistent worry about the "what ifs" and stated, "that's all I do". Patient reported experiencing worry daily. Patient reported feeling he  focuses on his mistakes, what he did not accomplish, and reported feeling he focuses on the negative aspect of situations.  Patient reported he would like to be in a relationship again, have a good relationship with his son, and be more social as it relates to goals for therapy.   Interventions: Motivational Interviewing. Discussed recent changes in psychotropic medications. Discussed recent stressors and symptoms of anxiety. Clinician utilized motivational interviewing to explore potential goals for therapy. Clinician utilized a task centered approach in collaboration with patient to begin to develop goals for therapy.   Diagnosis:  Mild episode of recurrent major depressive disorder ([facility])  Generalized anxiety disorder   Plan: Patient is to utilize [facility], thought re-framing, positive self talk, mindfulness and coping strategies to decrease symptoms associated with Major Depressive Disorder and Generalized Anxiety Disorder.  Frequency: bi-weekly  Modality: individual     Long-term goal:   Patient stated, "I would like to find a way to forgive and love myself" and stated,  "I'd like to be happy".   Reduce overall level, frequency, and intensity of the feelings of depression and anxiety as evidenced by decreased depressed mood, worry, negative self talk, low self esteem, lack of energy, fatigue, lack of appetite, difficulty falling asleep, racing thoughts at night, change in concentration, and loss of interest from 7 days per week to 5 to 6 days per week per patient's report  Target Date: 06/22/23  Progress: 0   Short-term goal:  To be finalized during follow up appointment on 07/05/22.  Nova Schmuhl, LCSW 

## 2022-07-05 ENCOUNTER — Encounter: Payer: Self-pay | Admitting: Nurse Practitioner

## 2022-07-05 ENCOUNTER — Ambulatory Visit
Admission: RE | Admit: 2022-07-05 | Discharge: 2022-07-05 | Disposition: A | Discharge status: Home / Self Care | Payer: BC Managed Care – PPO | Source: Ambulatory Visit

## 2022-07-05 ENCOUNTER — Ambulatory Visit: Payer: BC Managed Care – PPO | Admitting: Clinical

## 2022-07-05 VITALS — BP 149/94 | HR 73 | Temp 98.9°F | Resp 18

## 2022-07-05 DIAGNOSIS — J019 Acute sinusitis, unspecified: Secondary | ICD-10-CM

## 2022-07-05 MED ORDER — AZITHROMYCIN 250 MG PO TABS: ORAL_TABLET | ORAL | 0 refills | Status: DC

## 2022-07-05 MED ORDER — PREDNISONE 20 MG PO TABS
ORAL_TABLET | ORAL | 0 refills | Status: AC
Start: 2022-07-05 — End: 2022-07-10

## 2022-07-05 NOTE — ED Triage Notes (Signed)
Pt. Presents to UC w/ c/o a productive cough, sinus pressure, chest congestion and nasal congestion.

## 2022-07-05 NOTE — ED Provider Notes (Signed)
Barry Perez    CSN: [id number] Arrival date & time: 07/05/22  1816      History   Chief Complaint Chief Complaint  Patient presents with   Cough    I think I've got a sinus infection. - Entered by patient   Facial Pain    HPI Barry Perez is a 61 y.o. male.    Cough   Presents to urgent care with complaint of productive cough, sinus pressure, chest congestion, nasal congestion.  Symptoms since 1 week and acutely worsening in the past few days.  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 Authorizing Provider  FLUoxetine (PROZAC) 20 MG capsule Take 20 mg by mouth daily. 07/03/22  Yes [provider]  aspirin 81 MG chewable tablet Chew 81 mg by mouth daily.    [provider]  atorvastatin (LIPITOR) 20 MG tablet TAKE 1 TABLET BY MOUTH EVERY DAY 06/02/22   Michela Pitcher, NP  buPROPion (WELLBUTRIN XL) 150 MG 24 hr tablet Take 1 tablet (150 mg total) by mouth daily. 03/29/22   Michela Pitcher, NP  escitalopram (LEXAPRO) 20 MG tablet Take 1 tablet (20 mg total) by mouth daily. 04/27/22 06/02/22  Eugenia Pancoast, FNP  eszopiclone 3 MG TABS Take 1 tablet (3 mg total)  by mouth at bedtime. Take immediately before bedtime 06/02/22   Michela Pitcher, NP  montelukast (SINGULAIR) 10 MG tablet Take 1 tablet (10 mg total) by mouth daily. 03/29/22   Michela Pitcher, NP    Family History Family History  Problem Relation Age of Onset   Hypertension Mother    Hyperlipidemia Mother    Diabetes Mother    COPD Father    Hyperlipidemia Father    Hypertension Father     Social History Social History   Tobacco Use   Smoking status: Every Day    Packs/day: 1.00    Years: 23.00    Total pack years: 23.00    Types: Cigarettes   Smokeless tobacco: Never  Vaping Use   Vaping Use: Never used  Substance Use Topics   Alcohol use: Yes    Comment: liqour 1-2 with ice a night   Drug use: Not Currently     Allergies   Erythromycin   Review of Systems Review of Systems  Respiratory:  Positive for cough.      Physical Exam Triage Vital Signs ED Triage Vitals  Enc Vitals Group     BP 07/05/22 1909 (!) 149/94     Pulse Rate 07/05/22 1909 73     Resp 07/05/22 1909 18  Temp 07/05/22 1909 98.9 F (37.2 C)     Temp Source 07/05/22 1909 Oral     SpO2 07/05/22 1909 94 %     Weight --      Height --      Head Circumference --      Peak Flow --      Pain Score 07/05/22 1904 0     Pain Loc --      Pain Edu? --      Excl. in [place]? --    No data found.  Updated Vital Signs BP (!) 149/94 (BP Location: Left Arm)   Pulse 73   Temp 98.9 F (37.2 C) (Oral)   Resp 18   SpO2 94%   Visual Acuity Right Eye Distance:   Left Eye Distance:   Bilateral Distance:    Right Eye Near:   Left Eye Near:    Bilateral Near:     Physical Exam Vitals reviewed.  Constitutional:      Appearance: Normal appearance.  HENT:     Nose:     Right Sinus: Maxillary sinus tenderness and frontal sinus tenderness present.     Left Sinus: Maxillary sinus tenderness and frontal sinus tenderness present.  Skin:    General: Skin is warm and dry.  Neurological:      General: No focal deficit present.     Mental Status: He is alert and oriented to person, place, and time.  Psychiatric:        Mood and Affect: Mood normal.        Behavior: Behavior normal.      UC Treatments / Results  Labs (all labs ordered are listed, but only abnormal results are displayed) Labs Reviewed - No data to display  EKG   Radiology No results found.  Procedures Procedures (including critical care time)  Medications Ordered in UC Medications - No data to display  Initial Impression / Assessment and Plan / UC Course  I have reviewed the triage vital signs and the nursing notes.  Pertinent labs & imaging results that were available during my care of the patient were reviewed by me and considered in my medical decision making (see chart for details).   Frontal and maxillary sinus tenderness is present bilaterally.  Given symptoms of approximately 1 week concern for onset of secondary bacterial infection.  I think it is reasonable to treat with a Z-Pak at his request.  However recommended using prednisone course to reduce sinus inflammation in the event symptoms are still viral.   Final Clinical Impressions(s) / UC Diagnoses   Final diagnoses:  None   Discharge Instructions   None    ED Prescriptions   None    PDMP not reviewed this encounter.   Rose Phi, [place] 07/05/22 1924

## 2022-07-05 NOTE — Discharge Instructions (Addendum)
Recommend use of Sudafed sinus (pseudoephedrine) to treat your symptoms.  Also prescribing azithromycin to cover any bacterial secondary infection.  Do not start ordered prednisone until tomorrow morning.  Follow up here or with your primary care provider if your symptoms are worsening or not improving with treatment.

## 2022-07-11 DIAGNOSIS — F331 Major depressive disorder, recurrent, moderate: Secondary | ICD-10-CM | POA: Diagnosis not present

## 2022-07-11 DIAGNOSIS — F411 Generalized anxiety disorder: Secondary | ICD-10-CM | POA: Diagnosis not present

## 2022-07-19 ENCOUNTER — Ambulatory Visit: Payer: BC Managed Care – PPO | Admitting: Clinical

## 2022-07-26 ENCOUNTER — Ambulatory Visit: Payer: BC Managed Care – PPO | Admitting: Clinical

## 2022-08-09 DIAGNOSIS — F331 Major depressive disorder, recurrent, moderate: Secondary | ICD-10-CM | POA: Diagnosis not present

## 2022-08-09 DIAGNOSIS — F411 Generalized anxiety disorder: Secondary | ICD-10-CM | POA: Diagnosis not present

## 2022-08-30 DIAGNOSIS — F331 Major depressive disorder, recurrent, moderate: Secondary | ICD-10-CM | POA: Diagnosis not present

## 2022-08-30 DIAGNOSIS — F411 Generalized anxiety disorder: Secondary | ICD-10-CM | POA: Diagnosis not present

## 2022-09-07 ENCOUNTER — Telehealth: Payer: BC Managed Care – PPO | Admitting: Nurse Practitioner

## 2022-09-07 DIAGNOSIS — B9689 Other specified bacterial agents as the cause of diseases classified elsewhere: Secondary | ICD-10-CM

## 2022-09-07 DIAGNOSIS — J019 Acute sinusitis, unspecified: Secondary | ICD-10-CM | POA: Diagnosis not present

## 2022-09-07 MED ORDER — AMOXICILLIN-POT CLAVULANATE 875-125 MG PO TABS: 1.0000 | ORAL_TABLET | Freq: Two times a day (BID) | ORAL | 0 refills | Status: AC

## 2022-09-07 MED ORDER — AZELASTINE HCL 0.1 % NA SOLN: 2.0000 | Freq: Two times a day (BID) | NASAL | 0 refills | Status: AC

## 2022-09-07 NOTE — Patient Instructions (Signed)
Barry Perez, thank you for joining [name], NP for today's virtual visit.  While this provider is not your primary care provider (PCP), if your PCP is located in our provider database this encounter information will be shared with them immediately following your visit.   [facility] account gives you access to today's visit and all your visits, tests, and labs performed at [facility] " click here if you don't have a [facility] account or go to [email or web address]  Consent: (Patient) Barry Perez provided verbal consent for this virtual visit at the beginning of the encounter.  Current Medications:  Current Outpatient Medications:    amoxicillin-clavulanate (AUGMENTIN) 875-125 MG tablet, Take 1 tablet by mouth 2 (two) times daily for 7 days., Disp: 14 tablet, Rfl: 0   azelastine (ASTELIN) 0.1 % nasal spray, Place 2 sprays into both nostrils 2 (two) times daily. Use in each nostril as directed, Disp: 30 mL, Rfl: 0   aspirin 81 MG chewable tablet, Chew 81 mg by mouth daily., Disp: , Rfl:    atorvastatin (LIPITOR) 20 MG tablet, TAKE 1 TABLET BY MOUTH EVERY DAY, Disp: 90 tablet, Rfl: 1   azithromycin (ZITHROMAX Z-PAK) 250 MG tablet, Take 2 tablets (500 mg) today, then 1 tablet (250 mg) for next 4 days., Disp: 6 tablet, Rfl: 0   buPROPion (WELLBUTRIN XL) 150 MG 24 hr tablet, Take 1 tablet (150 mg total) by mouth daily., Disp: 90 tablet, Rfl: 1   escitalopram (LEXAPRO) 20 MG tablet, Take 1 tablet (20 mg total) by mouth daily., Disp: 30 tablet, Rfl: 0   eszopiclone 3 MG TABS, Take 1 tablet (3 mg total) by mouth at bedtime. Take immediately before bedtime, Disp: 30 tablet, Rfl: 0   FLUoxetine (PROZAC) 20 MG capsule, Take 20 mg by mouth daily., Disp: , Rfl:    montelukast (SINGULAIR) 10 MG tablet, Take 1 tablet (10 mg total) by mouth daily., Disp: 90 tablet, Rfl: 1   Medications ordered in this encounter:  Meds ordered this encounter  Medications    amoxicillin-clavulanate (AUGMENTIN) 875-125 MG tablet    Sig: Take 1 tablet by mouth 2 (two) times daily for 7 days.    Dispense:  14 tablet    Refill:  0    Order Specific Question:   Supervising Provider    Answer:   Chase Picket [[id number]]   azelastine (ASTELIN) 0.1 % nasal spray    Sig: Place 2 sprays into both nostrils 2 (two) times daily. Use in each nostril as directed    Dispense:  30 mL    Refill:  0    Order Specific Question:   Supervising Provider    Answer:   Chase Picket [[id number]]     *If you need refills on other medications prior to your next appointment, please contact your pharmacy*  Follow-Up: Call back or seek an in-person evaluation if the symptoms worsen or if the condition fails to improve as anticipated.  [facility] [phone]  Other Instructions INSTRUCTIONS: use a humidifier for nasal congestion Drink plenty of fluids, rest and wash hands frequently to avoid the spread of infection Alternate tylenol and Motrin for relief of fever    If you have been instructed to have an in-person evaluation today at a local Urgent Care facility, please use the link below. It will take you to a list of all of our available [facility], including address, phone number and hours of operation.  Please do not delay care.  [facility]  If you or a family member do not have a primary care provider, use the link below to schedule a visit and establish care. When you choose a [facility] primary care physician or advanced practice provider, you gain a long-term partner in health. Find a Primary Care Provider  Learn more about [facility]'s in-office and virtual care options: [facility]

## 2022-09-07 NOTE — Progress Notes (Signed)
Virtual Visit Consent   Barry Perez, you are scheduled for a virtual visit with a [facility] provider today. Just as with appointments in the office, your consent must be obtained to participate. Your consent will be active for this visit and any virtual visit you may have with one of our providers in the next 365 days. If you have a MyChart account, a copy of this consent can be sent to you electronically.  As this is a virtual visit, video technology does not allow for your provider to perform a traditional examination. This may limit your provider's ability to fully assess your condition. If your provider identifies any concerns that need to be evaluated in person or the need to arrange testing (such as labs, EKG, etc.), we will make arrangements to do so. Although advances in technology are sophisticated, we cannot ensure that it will always work on either your end or our end. If the connection with a video visit is poor, the visit may have to be switched to a telephone visit. With either a video or telephone visit, we are not always able to ensure that we have a secure connection.  By engaging in this virtual visit, you consent to the provision of healthcare and authorize for your insurance to be billed (if applicable) for the services provided during this visit. Depending on your insurance coverage, you may receive a charge related to this service.  I need to obtain your verbal consent now. Are you willing to proceed with your visit today? Barry Perez has provided verbal consent on 09/07/2022 for a virtual visit (video or telephone). Barry Pounds, NP  Date: 09/07/2022 5:01 PM  Virtual Visit via Video Note   I, Barry Perez, connected with  Barry Perez  ([id number], 07-09-1961) on 09/07/22 at  5:00 PM EST by a video-enabled telemedicine application and verified that I am speaking with the correct person using two identifiers.  Location: Patient: Virtual Visit Location Patient:  Home Provider: Virtual Visit Location Provider: Home Office   I discussed the limitations of evaluation and management by telemedicine and the availability of in person appointments. The patient expressed understanding and agreed to proceed.    History of Present Illness: Barry Perez is a 62 y.o. who identifies as a male who was assigned male at birth, and is being seen today for sinusitis symptoms.  Barry  Perez endorses a history of recurrent sinusitis. His currently symptoms are similar to his previous sinus infections.  He endorses bilateral ear pressure, frontal and maxillary pressure and pain, headache, rhinorrhea and nasal congestion. Symptoms have been present for a few days. He initially requested zpak however I did instruct him that zpak is not first line for sinusitis.    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 12/23/2021   Anxiety and depression 12/23/2021   Carotid stenosis, asymptomatic, left 12/23/2021   Vitamin D deficiency 12/23/2021   Encounter for medical examination to establish care 12/23/2021   Hyperlipidemia 12/23/2021   Other fatigue 12/23/2021   Overweight 12/23/2021   Facial droop 08/23/2020   Insomnia 05/21/2019    Allergies:  Allergies  Allergen Reactions   Erythromycin Nausea And Vomiting and Other (See Comments)   Medications:  Current Outpatient Medications:    amoxicillin-clavulanate (AUGMENTIN) 875-125 MG tablet, Take 1 tablet by  mouth 2 (two) times daily for 7 days., Disp: 14 tablet, Rfl: 0   azelastine (ASTELIN) 0.1 % nasal spray, Place 2 sprays into both nostrils 2 (two) times daily. Use in each nostril as directed, Disp: 30 mL, Rfl: 0   aspirin 81 MG chewable tablet, Chew 81 mg by mouth daily., Disp: , Rfl:    atorvastatin (LIPITOR) 20 MG tablet, TAKE 1 TABLET BY MOUTH  EVERY DAY, Disp: 90 tablet, Rfl: 1   azithromycin (ZITHROMAX Z-PAK) 250 MG tablet, Take 2 tablets (500 mg) today, then 1 tablet (250 mg) for next 4 days., Disp: 6 tablet, Rfl: 0   buPROPion (WELLBUTRIN XL) 150 MG 24 hr tablet, Take 1 tablet (150 mg total) by mouth daily., Disp: 90 tablet, Rfl: 1   escitalopram (LEXAPRO) 20 MG tablet, Take 1 tablet (20 mg total) by mouth daily., Disp: 30 tablet, Rfl: 0   eszopiclone 3 MG TABS, Take 1 tablet (3 mg total) by mouth at bedtime. Take immediately before bedtime, Disp: 30 tablet, Rfl: 0   FLUoxetine (PROZAC) 20 MG capsule, Take 20 mg by mouth daily., Disp: , Rfl:    montelukast (SINGULAIR) 10 MG tablet, Take 1 tablet (10 mg total) by mouth daily., Disp: 90 tablet, Rfl: 1  Observations/Objective: Patient is well-developed, well-nourished in no acute distress.  Resting comfortably  at home.  Head is normocephalic, atraumatic.  No labored breathing.  Speech is clear and coherent with logical content.  Patient is alert and oriented at baseline.    Assessment and Plan: 1. Acute bacterial sinusitis - amoxicillin-clavulanate (AUGMENTIN) 875-125 MG tablet; Take 1 tablet by mouth 2 (two) times daily for 7 days.  Dispense: 14 tablet; Refill: 0 - azelastine (ASTELIN) 0.1 % nasal spray; Place 2 sprays into both nostrils 2 (two) times daily. Use in each nostril as directed  Dispense: 30 mL; Refill: 0  INSTRUCTIONS: use a humidifier for nasal congestion Drink plenty of fluids, rest and wash hands frequently to avoid the spread of infection Alternate tylenol and Motrin for relief of fever   Follow Up Instructions: I discussed the assessment and treatment plan with the patient. The patient was provided an opportunity to ask questions and all were answered. The patient agreed with the plan and demonstrated an understanding of the instructions.  A copy of instructions were sent to the patient via MyChart unless otherwise noted below.    The patient was advised  to call back or seek an in-person evaluation if the symptoms worsen or if the condition fails to improve as anticipated.  Time:  I spent 11 minutes with the patient via telehealth technology discussing the above problems/concerns.    Barry Pounds, NP

## 2022-09-25 ENCOUNTER — Other Ambulatory Visit: Payer: Self-pay | Admitting: Nurse Practitioner

## 2022-09-25 DIAGNOSIS — Z760 Encounter for issue of repeat prescription: Secondary | ICD-10-CM

## 2022-09-26 NOTE — Telephone Encounter (Signed)
Patient should have had follow up on fatigue in December. Do see where they have office visit set up for 4/29

## 2022-09-27 DIAGNOSIS — F411 Generalized anxiety disorder: Secondary | ICD-10-CM | POA: Diagnosis not present

## 2022-09-27 DIAGNOSIS — F331 Major depressive disorder, recurrent, moderate: Secondary | ICD-10-CM | POA: Diagnosis not present

## 2022-09-29 ENCOUNTER — Other Ambulatory Visit: Payer: Self-pay | Admitting: Nurse Practitioner

## 2022-09-29 DIAGNOSIS — B9689 Other specified bacterial agents as the cause of diseases classified elsewhere: Secondary | ICD-10-CM

## 2022-09-29 NOTE — Telephone Encounter (Signed)
Requested medication (s) are due for refill today - provider review   Requested medication (s) are on the active medication list -yes  Future visit scheduled -no  Last refill: 09/07/22  Notes to clinic: not sue if patient-[facility] patient- seen on virtual platform- sent for review   Requested Prescriptions  Pending Prescriptions Disp Refills   Azelastine HCl 137 MCG/SPRAY SOLN [Pharmacy Med Name: AZELASTINE 0.1% (137 MCG) SPRY]  1    Sig: PLACE 2 SPRAYS INTO BOTH NOSTRILS 2 (TWO) TIMES DAILY. USE IN EACH NOSTRIL AS DIRECTED     Ear, Nose, and Throat: Nasal Preparations - Antiallergy Failed - 09/29/2022  1:31 PM      Failed - Valid encounter within last 12 months    Recent Outpatient Visits   None     Future Appointments             In 2 months Cable, Alyson Locket, NP [place] at [place], [facility]               Requested Prescriptions  Pending Prescriptions Disp Refills   Azelastine HCl 137 MCG/SPRAY SOLN [Pharmacy Med Name: AZELASTINE 0.1% (137 MCG) SPRY]  1    Sig: PLACE 2 SPRAYS INTO BOTH NOSTRILS 2 (TWO) TIMES DAILY. USE IN EACH NOSTRIL AS DIRECTED     Ear, Nose, and Throat: Nasal Preparations - Antiallergy Failed - 09/29/2022  1:31 PM      Failed - Valid encounter within last 12 months    Recent Outpatient Visits   None     Future Appointments             In 2 months Cable, Alyson Locket, NP [place] at [place], [facility]

## 2022-10-25 DIAGNOSIS — F331 Major depressive disorder, recurrent, moderate: Secondary | ICD-10-CM | POA: Diagnosis not present

## 2022-10-25 DIAGNOSIS — F411 Generalized anxiety disorder: Secondary | ICD-10-CM | POA: Diagnosis not present

## 2022-11-08 ENCOUNTER — Ambulatory Visit: Payer: BC Managed Care – PPO | Admitting: Family Medicine

## 2022-11-08 ENCOUNTER — Encounter: Payer: Self-pay | Admitting: Family Medicine

## 2022-11-08 ENCOUNTER — Ambulatory Visit (INDEPENDENT_AMBULATORY_CARE_PROVIDER_SITE_OTHER)
Admission: RE | Admit: 2022-11-08 | Discharge: 2022-11-08 | Disposition: A | Discharge status: Home / Self Care | Payer: BC Managed Care – PPO | Source: Ambulatory Visit | Attending: Family Medicine | Admitting: Family Medicine

## 2022-11-08 VITALS — BP 122/78 | HR 84 | Temp 97.4°F | Ht 69.5 in | Wt 169.0 lb

## 2022-11-08 DIAGNOSIS — M21371 Foot drop, right foot: Secondary | ICD-10-CM | POA: Diagnosis not present

## 2022-11-08 MED ORDER — CETIRIZINE HCL 10 MG PO TABS: 10.0000 mg | ORAL_TABLET | Freq: Every day | ORAL | Status: DC

## 2022-11-08 NOTE — Progress Notes (Signed)
Patient ID: Barry Perez, male    DOB: Jul 16, 1961, 62 y.o.   MRN: [id number]  This visit was conducted in person.  BP 122/78   Pulse 84   Temp (!) 97.4 F (36.3 C) (Temporal)   Ht 5' 9.5" (1.765 m)   Wt 169 lb (76.7 kg)   SpO2 95%   BMI 24.60 kg/m    CC: R foot weakness Subjective:   HPI: Barry Perez is a 62 y.o. male presenting on 11/08/2022 for Foot Problem (C/o not able flex R foot. Denies pain. Noticed about 10 days ago after feeling a cramp during the night. )   10d h/o sudden R foot drop that developed after bad nocturnal cramp to R leg that woke him up. Notes numbness to top of foot along with full weakness in dorsiflexion and eversion of right foot. Has had to alter gait and activities such as driving due to this.   No trauma/injury  No new exercise routine  No tight clothes.  No history of diabetes.  Alcohol - 2 mixed drinks/day. He does cross his legs regularly.   No shooting pain down legs, no bowel/bladder incontinence.  No fevers/chills, preceding viral infection, back pain, numbness or tingling elsewhere in body.  No headaches, vision changes, double vision.  No h/o leg or back surgeries.   H/o lumbar injury in his 92s. No issues since then.  H/o L CEA ~2022 - continues statin   H/o similar symptoms to left leg causing numbness/weakness from knee down ~17 yr ago - got better with chiropractor after 3 weeks.   H/o bell's palsy remotely.   No h/o multiple sclerosis or other neurological condition.      Relevant past medical, surgical, family and social history reviewed and updated as indicated. Interim medical history since our last visit reviewed. Allergies and medications reviewed and updated. Outpatient Medications Prior to Visit  Medication Sig Dispense Refill   aspirin 81 MG chewable tablet Chew 81 mg by mouth daily.     atorvastatin (LIPITOR) 20 MG tablet TAKE 1 TABLET BY MOUTH EVERY DAY 90 tablet 1   azelastine (ASTELIN) 0.1 % nasal spray Place  2 sprays into both nostrils 2 (two) times daily. Use in each nostril as directed 30 mL 0   buPROPion (WELLBUTRIN XL) 150 MG 24 hr tablet Take 1 tablet (150 mg total) by mouth daily. 90 tablet 1   eszopiclone 3 MG TABS Take 1 tablet (3 mg total) by mouth at bedtime. Take immediately before bedtime 30 tablet 0   FLUoxetine (PROZAC) 20 MG capsule Take 20 mg by mouth daily.     montelukast (SINGULAIR) 10 MG tablet TAKE 1 TABLET BY MOUTH EVERY DAY 90 tablet 0   escitalopram (LEXAPRO) 20 MG tablet Take 1 tablet (20 mg total) by mouth daily. 30 tablet 0   azithromycin (ZITHROMAX Z-PAK) 250 MG tablet Take 2 tablets (500 mg) today, then 1 tablet (250 mg) for next 4 days. 6 tablet 0   No facility-administered medications prior to visit.     Per HPI unless specifically indicated in ROS section below Review of Systems  Objective:  BP 122/78   Pulse 84   Temp (!) 97.4 F (36.3 C) (Temporal)   Ht 5' 9.5" (1.765 m)   Wt 169 lb (76.7 kg)   SpO2 95%   BMI 24.60 kg/m   Wt Readings from Last 3 Encounters:  11/08/22 169 lb (76.7 kg)  06/02/22 173 lb 4 oz (78.6 kg)  03/29/22 179 lb 8 oz (81.4 kg)      Physical Exam Vitals and nursing note reviewed.  Constitutional:      Appearance: Normal appearance. He is not ill-appearing.  HENT:     Mouth/Throat:     Mouth: Mucous membranes are moist.     Pharynx: Oropharynx is clear. No oropharyngeal exudate or posterior oropharyngeal erythema.  Eyes:     Extraocular Movements: Extraocular movements intact.     Pupils: Pupils are equal, round, and reactive to light.  Neck:     Thyroid: No thyroid mass or thyromegaly.     Vascular: No carotid bruit.  Cardiovascular:     Rate and Rhythm: Normal rate and regular rhythm.     Pulses: Normal pulses.     Heart sounds: Normal heart sounds. No murmur heard. Pulmonary:     Effort: Pulmonary effort is normal. No respiratory distress.     Breath sounds: Normal breath sounds. No wheezing, rhonchi or rales.   Musculoskeletal:        General: No swelling or tenderness.     Cervical back: Normal range of motion and neck supple.     Right lower leg: No edema.     Left lower leg: No edema.     Comments:  No pain midline spine No paraspinous mm tenderness Neg SLR bilaterally  No pain with int/ext rotation at hip Neg FABER No pain at SIJ, GTB or sciatic notch bilaterally. No pain or mass appreciated along peroneal nerve course to right lower leg  Skin:    General: Skin is warm and dry.     Findings: No rash.  Neurological:     Mental Status: He is alert.     Cranial Nerves: Cranial nerves 2-12 are intact. No cranial nerve deficit, dysarthria or facial asymmetry.     Sensory: Sensory deficit present.     Motor: Weakness present.     Coordination: Coordination is intact.     Deep Tendon Reflexes:     Reflex Scores:      Patellar reflexes are 2+ on the right side and 2+ on the left side.      Achilles reflexes are 1+ on the right side and 1+ on the left side.    Comments:  CN 2-12 intact FTN intact EOMI 5/5 strength BLE testing except for marked weakness with R ankle dorsiflexion and foot eversion Diminished sensation to lateral foot as well as medial and lateral lower leg - to light touch, temperature discrimination, and monofilament testing Steppage gait present  Psychiatric:        Mood and Affect: Mood normal.        Behavior: Behavior normal.        Lab Results  Component Value Date   CHOL 201 (H) 12/23/2021   HDL 59 12/23/2021   LDLCALC 121 (H) 12/23/2021   TRIG 99 12/23/2021   CHOLHDL 3.4 12/23/2021   Lab Results  Component Value Date   VITAMINB12 302 12/23/2021    Lab Results  Component Value Date   CREATININE 1.25 12/23/2021   BUN 17 12/23/2021   NA 139 12/23/2021   K 4.5 12/23/2021   CL 103 12/23/2021   CO2 26 12/23/2021    Lab Results  Component Value Date   ALT 8 (L) 12/23/2021   AST 12 12/23/2021   BILITOT 0.5 12/23/2021    Lab Results  Component  Value Date   WBC 8.7 12/23/2021   HGB 14.8 12/23/2021   HCT  44.8 12/23/2021   MCV 91.1 12/23/2021   PLT 238 12/23/2021    Assessment & Plan:   Problem List Items Addressed This Visit     Acquired right foot drop - Primary    Sudden development of R foot drop confirmed on exam - as well as numbness to entire dorsal foot and lateral lower leg seems consistent with common peroneal mononeuropathy.  Denies history of trauma, no obvious compressive etiology found, symptoms not consistent with lumbar radiculopathy/myelopathy or stroke although he does have history of L CEA - continue aspirin and statin.  He has history of similar issue to left leg of sudden weakness but over 15 yrs ago. He also endorses remote h/o lumbar trauma, without symptoms in interim.  Will need to r/o systemic neurological process such as MS. Recommended he schedule eye exam to check optic nerve looks healthy. Will also check labs today including B12, CBC, CMP, ANA. Check lumbar films today. Discussed further eval with possible lumbar MRI vs NCS/neurology referral pending above results.  Rec avoid crossing legs in interim.       Relevant Orders   DG Lumbar Spine Complete   Comprehensive metabolic panel   Vitamin 123456   CBC with Differential/Platelet   ANA     Meds ordered this encounter  Medications   cetirizine (ZYRTEC) 10 MG tablet    Sig: Take 1 tablet (10 mg total) by mouth daily.    Orders Placed This Encounter  Procedures   DG Lumbar Spine Complete    Standing Status:   Future    Number of Occurrences:   1    Standing Expiration Date:   11/08/2023    Order Specific Question:   Reason for Exam (SYMPTOM  OR DIAGNOSIS REQUIRED)    Answer:   R foot drop    Order Specific Question:   Preferred imaging location?    Answer:   [facility]   Comprehensive metabolic panel   Vitamin 123456   CBC with Differential/Platelet   ANA    Standing Status:   Future    Standing Expiration Date:   11/08/2023     Patient Instructions  For foot drop to right leg - labs and lower back xray today  Schedule eye doctor appointment to check optic nerve.  We may proceed with lumbar MRI pending results.   Follow up plan: Return if symptoms worsen or fail to improve.  Ria Bush, MD

## 2022-11-08 NOTE — Patient Instructions (Addendum)
For foot drop to right leg - labs and lower back xray today  Schedule eye doctor appointment to check optic nerve.  We may proceed with lumbar MRI pending results.

## 2022-11-08 NOTE — Assessment & Plan Note (Addendum)
Sudden development of R foot drop confirmed on exam - as well as numbness to entire dorsal foot and lateral lower leg seems consistent with common peroneal mononeuropathy.  Denies history of trauma, no obvious compressive etiology found, symptoms not consistent with lumbar radiculopathy/myelopathy or stroke although he does have history of L CEA - continue aspirin and statin.  He has history of similar issue to left leg of sudden weakness but over 15 yrs ago. He also endorses remote h/o lumbar trauma, without symptoms in interim.  Will need to r/o systemic neurological process such as MS. Recommended he schedule eye exam to check optic nerve looks healthy. Will also check labs today including B12, CBC, CMP, ANA. Check lumbar films today. Discussed further eval with possible lumbar MRI vs NCS/neurology referral pending above results.  Rec avoid crossing legs in interim.

## 2022-11-09 ENCOUNTER — Telehealth: Payer: Self-pay | Admitting: Family Medicine

## 2022-11-09 LAB — CBC WITH DIFFERENTIAL/PLATELET
Basophils Absolute: 0.2 10*3/uL — ABNORMAL HIGH (ref 0.0–0.1)
Basophils Relative: 2 % (ref 0.0–3.0)
Eosinophils Absolute: 0.1 10*3/uL (ref 0.0–0.7)
Eosinophils Relative: 1.3 % (ref 0.0–5.0)
HCT: 42.8 % (ref 39.0–52.0)
Hemoglobin: 14.7 g/dL (ref 13.0–17.0)
Lymphocytes Relative: 20.6 % (ref 12.0–46.0)
Lymphs Abs: 2.1 10*3/uL (ref 0.7–4.0)
MCHC: 34.3 g/dL (ref 30.0–36.0)
MCV: 90.8 fl (ref 78.0–100.0)
Monocytes Absolute: 0.9 10*3/uL (ref 0.1–1.0)
Monocytes Relative: 8.3 % (ref 3.0–12.0)
Neutro Abs: 7 10*3/uL (ref 1.4–7.7)
Neutrophils Relative %: 67.8 % (ref 43.0–77.0)
Platelets: 255 10*3/uL (ref 150.0–400.0)
RBC: 4.72 Mil/uL (ref 4.22–5.81)
RDW: 12.7 % (ref 11.5–15.5)
WBC: 10.3 10*3/uL (ref 4.0–10.5)

## 2022-11-09 LAB — COMPREHENSIVE METABOLIC PANEL
ALT: 13 U/L (ref 0–53)
AST: 16 U/L (ref 0–37)
Albumin: 4.2 g/dL (ref 3.5–5.2)
Alkaline Phosphatase: 67 U/L (ref 39–117)
BUN: 10 mg/dL (ref 6–23)
CO2: 29 mEq/L (ref 19–32)
Calcium: 9 mg/dL (ref 8.4–10.5)
Chloride: 100 mEq/L (ref 96–112)
Creatinine, Ser: 0.94 mg/dL (ref 0.40–1.50)
GFR: 87.63 mL/min (ref >60.00)
Glucose, Bld: 76 mg/dL (ref 70–99)
Potassium: 3.9 mEq/L (ref 3.5–5.1)
Sodium: 136 mEq/L (ref 135–145)
Total Bilirubin: 0.5 mg/dL (ref 0.2–1.2)
Total Protein: 6.7 g/dL (ref 6.0–8.3)

## 2022-11-09 LAB — VITAMIN B12: Vitamin B-12: 249 pg/mL (ref 211–911)

## 2022-11-09 NOTE — Telephone Encounter (Signed)
-----   Message from Ria Bush, MD sent at 11/08/2022  5:28 PM EDT ----- Can we add ANA to blood in lab? I ordered it future order. If not, can we have him return for rpt lab test?  And thanks for doing that xray yesterday!! Sorry if I got you in trouble!  Garlon Hatchet

## 2022-11-10 ENCOUNTER — Other Ambulatory Visit: Payer: BC Managed Care – PPO

## 2022-11-12 ENCOUNTER — Other Ambulatory Visit: Payer: Self-pay | Admitting: Family Medicine

## 2022-11-12 DIAGNOSIS — M21371 Foot drop, right foot: Secondary | ICD-10-CM

## 2022-11-13 ENCOUNTER — Other Ambulatory Visit: Payer: BC Managed Care – PPO

## 2022-11-13 DIAGNOSIS — M21371 Foot drop, right foot: Secondary | ICD-10-CM | POA: Diagnosis not present

## 2022-11-15 LAB — ANA: Anti Nuclear Antibody (ANA): NEGATIVE

## 2022-11-21 DIAGNOSIS — F331 Major depressive disorder, recurrent, moderate: Secondary | ICD-10-CM | POA: Diagnosis not present

## 2022-11-21 DIAGNOSIS — F411 Generalized anxiety disorder: Secondary | ICD-10-CM | POA: Diagnosis not present

## 2022-12-04 ENCOUNTER — Ambulatory Visit (INDEPENDENT_AMBULATORY_CARE_PROVIDER_SITE_OTHER): Payer: BC Managed Care – PPO | Admitting: Nurse Practitioner

## 2022-12-04 ENCOUNTER — Telehealth: Payer: Self-pay | Admitting: Nurse Practitioner

## 2022-12-04 ENCOUNTER — Encounter: Payer: Self-pay | Admitting: Nurse Practitioner

## 2022-12-04 VITALS — BP 118/76 | HR 76 | Temp 98.9°F | Resp 16 | Ht 69.5 in | Wt 171.4 lb

## 2022-12-04 DIAGNOSIS — Z9109 Other allergy status, other than to drugs and biological substances: Secondary | ICD-10-CM | POA: Insufficient documentation

## 2022-12-04 DIAGNOSIS — M21371 Foot drop, right foot: Secondary | ICD-10-CM

## 2022-12-04 DIAGNOSIS — Z0000 Encounter for general adult medical examination without abnormal findings: Secondary | ICD-10-CM

## 2022-12-04 DIAGNOSIS — G4700 Insomnia, unspecified: Secondary | ICD-10-CM | POA: Diagnosis not present

## 2022-12-04 DIAGNOSIS — E559 Vitamin D deficiency, unspecified: Secondary | ICD-10-CM | POA: Diagnosis not present

## 2022-12-04 DIAGNOSIS — F32A Depression, unspecified: Secondary | ICD-10-CM

## 2022-12-04 DIAGNOSIS — Z125 Encounter for screening for malignant neoplasm of prostate: Secondary | ICD-10-CM

## 2022-12-04 DIAGNOSIS — F419 Anxiety disorder, unspecified: Secondary | ICD-10-CM | POA: Diagnosis not present

## 2022-12-04 DIAGNOSIS — E785 Hyperlipidemia, unspecified: Secondary | ICD-10-CM

## 2022-12-04 DIAGNOSIS — Z720 Tobacco use: Secondary | ICD-10-CM

## 2022-12-04 DIAGNOSIS — Z122 Encounter for screening for malignant neoplasm of respiratory organs: Secondary | ICD-10-CM

## 2022-12-04 MED ORDER — ATORVASTATIN CALCIUM 20 MG PO TABS: 20.0000 mg | ORAL_TABLET | Freq: Every day | ORAL | 3 refills | Status: DC

## 2022-12-04 MED ORDER — LEVOCETIRIZINE DIHYDROCHLORIDE 5 MG PO TABS: 5.0000 mg | ORAL_TABLET | Freq: Every evening | ORAL | 3 refills | Status: DC

## 2022-12-04 MED ORDER — MONTELUKAST SODIUM 10 MG PO TABS: 10.0000 mg | ORAL_TABLET | Freq: Every day | ORAL | 3 refills | Status: DC

## 2022-12-04 NOTE — Assessment & Plan Note (Signed)
Been using Lunesta 3 mg as needed.  Last filled by psychiatry

## 2022-12-04 NOTE — Assessment & Plan Note (Signed)
Discussed age-appropriate immunizations and screening exams.  Did review patient's personal, surgical, social, family histories.  Patient is up-to-date on vaccines for his age range.  Patient is up-to-date on CRC screening.  Will drawl PSA for prostate cancer screening.  Patient was given information about preventative healthcare maintenance with anticipatory guidance for his age range.

## 2022-12-04 NOTE — Assessment & Plan Note (Signed)
History of the same.  Pending vitamin D level

## 2022-12-04 NOTE — Assessment & Plan Note (Signed)
Patient currently on Wellbutrin and fluoxetine.  He is being followed by psychiatry continue taking medications as prescribed and follow-up with specialty as recommended

## 2022-12-04 NOTE — Telephone Encounter (Signed)
Saw patient in office and he mentioned that you had mentioned getting an MRI of the lumbar spine? In regards to the foot drop on the right side

## 2022-12-04 NOTE — Progress Notes (Signed)
Established Patient Office Visit  Subjective   Patient ID: Barry Perez, male    DOB: 11-Jun-1961  Age: 62 y.o. MRN: [id number]  Chief Complaint  Patient presents with   Annual Exam    HPI  for complete physical and follow up of chronic conditions.   Anxiety/ depression: Patient currently maintained on Wellbutrin and fluoxetine 20 mg.  At last office visit patient was going to see a psychiatrist or talk to a therapist. [facility].  Patient states she has got medications to manage he is feeling much better.  Insomnia: Patient currently maintained on Lunesta 3 mg total at night. States that he has not been sleeping the best.  States he takes only as needed.  He would like a refill  Allergies: Currently maintained on Singulair 10 mg and zyrtec. States tha his psychiatrist told him to switch from Zyrtec because his no longer effective.  Seizure: States that he has not had a seizure. States that he is scheduled to see a neurologist over his right side of drop foot    Immunizations: -Tetanus: Completed in 2021 -Influenza: out of season  -Shingles: Completed Shingrix series -Pneumonia: too young  Covid: moderna   Diet: Fair diet. States that he is eating 2 meals a day mosty. Sometimes 1 a day. State states coffee,water, and soda  Exercise: No regular exercise.  Eye exam: PRN  Dental exam: Completes semi-annually    Colonoscopy: Completed in 12/25/2017, repeat in 10 years Lung Cancer Screening: Due  PSA: Due  Sleep: states that he is going to bed around 10 and will get up and get approx 8-9. Does snore.     Review of Systems  Constitutional:  Negative for chills and fever.  Respiratory:  Negative for shortness of breath.   Cardiovascular:  Negative for chest pain and leg swelling.  Gastrointestinal:  Negative for abdominal pain, blood in stool, constipation, diarrhea, nausea and vomiting.       Bm daily   Genitourinary:  Negative for dysuria and hematuria.   Neurological:  Negative for tingling and headaches.  Psychiatric/Behavioral:  Negative for hallucinations and suicidal ideas.       Objective:     BP 118/76   Pulse 76   Temp 98.9 F (37.2 C)   Resp 16   Ht 5' 9.5" (1.765 m)   Wt 171 lb 6 oz (77.7 kg)   SpO2 97%   BMI 24.94 kg/m    Physical Exam Vitals and nursing note reviewed.  Constitutional:      Appearance: Normal appearance.  HENT:     Right Ear: Tympanic membrane, ear canal and external ear normal.     Left Ear: Tympanic membrane, ear canal and external ear normal.     Mouth/Throat:     Mouth: Mucous membranes are moist.     Pharynx: Oropharynx is clear.  Eyes:     Extraocular Movements: Extraocular movements intact.     Pupils: Pupils are equal, round, and reactive to light.  Cardiovascular:     Rate and Rhythm: Normal rate and regular rhythm.     Pulses: Normal pulses.     Heart sounds: Normal heart sounds.  Pulmonary:     Effort: Pulmonary effort is normal.     Breath sounds: Normal breath sounds.  Abdominal:     General: Bowel sounds are normal. There is no distension.     Palpations: There is no mass.     Tenderness: There is no abdominal tenderness.  Hernia: No hernia is present.  Genitourinary:    Comments: deferred Musculoskeletal:     Right lower leg: No edema.     Left lower leg: No edema.  Lymphadenopathy:     Cervical: No cervical adenopathy.  Skin:    General: Skin is warm.  Neurological:     General: No focal deficit present.     Mental Status: He is alert.     Deep Tendon Reflexes:     Reflex Scores:      Bicep reflexes are 2+ on the right side and 2+ on the left side.      Patellar reflexes are 2+ on the right side and 2+ on the left side.    Comments: Bilateral upper and lower extremity strength 5/5  Psychiatric:        Mood and Affect: Mood normal.        Behavior: Behavior normal.        Thought Content: Thought content normal.        Judgment: Judgment normal.       No results found for any visits on 12/04/22.    The ASCVD Risk score (Arnett DK, et al., 2019) failed to calculate for the following reasons:   Unable to determine if patient is Non-Hispanic African American    Assessment & Plan:   Problem List Items Addressed This Visit       Other   Insomnia    Been using Lunesta 3 mg as needed.  Last filled by psychiatry      Relevant Orders   TSH   Anxiety and depression - Primary    Patient currently on Wellbutrin and fluoxetine.  He is being followed by psychiatry continue taking medications as prescribed and follow-up with specialty as recommended      Relevant Orders   CBC   Comprehensive metabolic panel   Vitamin D deficiency    History of the same.  Pending vitamin D level      Relevant Orders   VITAMIN D 25 Hydroxy (Vit-D Deficiency, Fractures)   Preventative health care    Discussed age-appropriate immunizations and screening exams.  Did review patient's personal, surgical, social, family histories.  Patient is up-to-date on vaccines for his age range.  Patient is up-to-date on CRC screening.  Will drawl PSA for prostate cancer screening.  Patient was given information about preventative healthcare maintenance with anticipatory guidance for his age range.      Relevant Orders   CBC   Comprehensive metabolic panel   TSH   Hyperlipidemia   Relevant Medications   atorvastatin (LIPITOR) 20 MG tablet   Other Relevant Orders   Lipid panel   Acquired right foot drop    Patient still dealing with right foot drop.  Was evaluated by colleague and has been referred to neurology patient question MRI.  Will send that provider who evaluated him message seeing if he wanted to pursue MRI.      Environmental allergies    Patient was maintained on Singulair and Zyrtec.  States Zyrtec was not effective we will switch from Zyrtec to levocetirizine prescription sent in today.  Refill provided for Singulair      Relevant  Medications   montelukast (SINGULAIR) 10 MG tablet   levocetirizine (XYZAL) 5 MG tablet   Tobacco use    Current smoker send off urine to rule out microscopic hematuria      Relevant Orders   Urine Microscopic   Other Visit Diagnoses  Screening for prostate cancer       Relevant Orders   PSA   Screening for lung cancer       Relevant Orders   Ambulatory Referral Lung Cancer Screening [facility]       Return in about 1 year (around 12/04/2023).    Audria Nine, NP

## 2022-12-04 NOTE — Assessment & Plan Note (Signed)
Patient still dealing with right foot drop.  Was evaluated by colleague and has been referred to neurology patient question MRI.  Will send that provider who evaluated him message seeing if he wanted to pursue MRI.

## 2022-12-04 NOTE — Assessment & Plan Note (Signed)
Current smoker send off urine to rule out microscopic hematuria

## 2022-12-04 NOTE — Telephone Encounter (Signed)
Neurology appt not scheduled until June.  I agree with proceeding with lumbar MRI - I've ordered to be done urgently for further evaluation.  Sent to referral coordinators.

## 2022-12-04 NOTE — Assessment & Plan Note (Signed)
Patient was maintained on Singulair and Zyrtec.  States Zyrtec was not effective we will switch from Zyrtec to levocetirizine prescription sent in today.  Refill provided for Singulair

## 2022-12-04 NOTE — Patient Instructions (Signed)
Nice to see you today I have placed your lab orders. Get a fasting lab appointment within the next 2 weeks Follow up with me in 1 year for your next physical sooner if you need me

## 2022-12-05 NOTE — Telephone Encounter (Signed)
Noted.  I will take care of getting the MRI scheduled.

## 2022-12-05 NOTE — Addendum Note (Signed)
Addended by: Alvina Chou on: 12/05/2022 07:51 AM   Modules accepted: Orders

## 2022-12-06 ENCOUNTER — Encounter: Payer: Self-pay | Admitting: Family Medicine

## 2022-12-08 ENCOUNTER — Other Ambulatory Visit: Payer: BC Managed Care – PPO

## 2023-01-02 DIAGNOSIS — F331 Major depressive disorder, recurrent, moderate: Secondary | ICD-10-CM | POA: Diagnosis not present

## 2023-01-02 DIAGNOSIS — F411 Generalized anxiety disorder: Secondary | ICD-10-CM | POA: Diagnosis not present

## 2023-01-11 ENCOUNTER — Encounter: Payer: Self-pay | Admitting: Neurology

## 2023-01-11 ENCOUNTER — Ambulatory Visit (INDEPENDENT_AMBULATORY_CARE_PROVIDER_SITE_OTHER): Payer: BC Managed Care – PPO | Admitting: Neurology

## 2023-01-11 VITALS — BP 88/57 | HR 78 | Ht 70.0 in | Wt 171.0 lb

## 2023-01-11 DIAGNOSIS — M21371 Foot drop, right foot: Secondary | ICD-10-CM | POA: Diagnosis not present

## 2023-01-11 DIAGNOSIS — G5731 Lesion of lateral popliteal nerve, right lower limb: Secondary | ICD-10-CM | POA: Diagnosis not present

## 2023-01-11 NOTE — Patient Instructions (Addendum)
[facility] [address] - xray right knee, walk in  Physical therapy  Orders Placed This Encounter  Procedures   DG Knee 3 Views Right   Ambulatory referral to Physical Therapy   NCV with EMG(electromyography)    Electromyoneurogram Electromyoneurogram is a test to check how well your muscles and nerves are working. This procedure includes the combined use of electromyogram (EMG) and nerve conduction study (NCS). EMG is used to evaluate muscles and the nerves that control those muscles. NCS, which is also called electroneurogram, measures how well your nerves conduct electricity. The procedures should be done together to check if your muscles and nerves are healthy. If the results of the tests are abnormal, this may indicate disease or injury, such as a neuromuscular disease or peripheral nerve damage. Tell a health care provider about: Any allergies you have. All medicines you are taking, including vitamins, herbs, eye drops, creams, and over-the-counter medicines. Any bleeding problems you have. Any surgeries you have had. Any medical conditions you have. What are the risks? Generally, this is a safe procedure. However, problems may occur, including: Bleeding or bruising. Infection where the electrodes were inserted. What happens before the test? Medicines Take all of your usually prescribed medications before this testing is performed. Do not stop your blood thinners unless advised by your prescribing physician. General instructions Your health care provider may ask you to warm the limb that will be checked with warm water, hot pack, or wrapping the limb in a blanket. Do not use lotions or creams on the same day that you will be having the procedure. What happens during the test? For EMG  Your health care provider will ask you to stay in a position so that the muscle being studied can be accessed. You will be sitting or lying down. You may be given a medicine to numb the area  (local anesthetic) and the skin will be disinfected. A very thin needle that has an electrode will be inserted into your muscle, one muscle at a time. Typically, multiple muscles are evaluated during a single study. Another small electrode will be placed on your skin near the muscle. Your health care provider will ask you to continue to remain still. The electrodes will record the electrical activity of your muscles. You may see this on a monitor or hear it in the room. After your muscles have been studied at rest, your health care provider will ask you to contract or flex your muscles. The electrodes will record the electrical activity of your muscles. Your health care provider will remove the electrodes and the electrode needle when the procedure is finished. The procedure may vary among health care providers and hospitals. For NCS  An electrode that records your nerve activity (recording electrode) will be placed on your skin by the muscle that is being studied. An electrode that is used as a reference (reference electrode) will be placed near the recording electrode. A paste or gel will be applied to your skin between the recording electrode and the reference electrode. Your nerve will be stimulated with a mild shock. The speed of the nerves and strength of response is recorded by the electrodes. Your health care provider will remove the electrodes and the gel when the procedure is finished. The procedure may vary among health care providers and hospitals. What can I expect after the test? It is up to you to get your test results. Ask your health care provider, or the department that is doing the  test, when your results will be ready. Your health care provider may: Give you medicines for any pain. Monitor the insertion sites to make sure that bleeding stops. You should be able to drive yourself to and from the test. Discomfort can persist for a few hours after the test, but should be better  the next day. Contact a health care provider if: You have swelling, redness, or drainage at any of the insertion sites. Summary Electromyoneurogram is a test to check how well your muscles and nerves are working. If the results of the tests are abnormal, this may indicate disease or injury. This is a safe procedure. However, problems may occur, such as bleeding and infection. Your health care provider will do two tests to complete this procedure. One checks your muscles (EMG) and another checks your nerves (NCS). It is up to you to get your test results. Ask your health care provider, or the department that is doing the test, when your results will be ready. This information is not intended to replace advice given to you by your health care provider. Make sure you discuss any questions you have with your health care provider. Document Revised: 04/06/2021 Document Reviewed: 03/06/2021 Elsevier Patient Education  2024 Else[employer] Inc.   Common Peroneal Nerve Entrapment  Common peroneal nerve entrapment is a condition that can make it hard to lift a foot. The condition results from pressure on a nerve in the lower leg called the common peroneal nerve. Your common peroneal nerve provides feeling to your outer lower leg and foot. It also supplies the muscles that move your foot and toes upward and outward. What are the causes? This condition may be caused by: Sitting cross-legged, squatting, or kneeling for long periods of time. A hard, direct hit to the outside of the lower leg. Swelling from a knee injury. A break or fracture in one of the lower leg bones. Wearing a boot or cast that ends just below the knee. A growth or cyst near the nerve. What increases the risk? This condition is more likely to develop in people who play: Contact sports, such as football or hockey. Sports where you wear high and stiff boots, such as skiing. What are the signs or symptoms? Symptoms of this condition  include: Trouble lifting your foot up (foot drop). Tripping often. Your foot hitting the ground harder than normal as you walk, resulting in a slapping-type sound. Numbness, tingling, or pain in the outside of the knee, outside of the lower leg, and top of the foot. Sensitivity to pressure on the front or outside of the leg. How is this diagnosed? This condition may be diagnosed based on: Your symptoms. Your medical history. A physical exam. Tests, such as: X-rays to check the bones of your knee and leg. MRI to check tendons that attach to the side of your knee. Ultrasound to check for a growth or cyst. Electromyogram (EMG) to check your nerves. During your physical exam, your health care provider will check for numbness in your leg and test the strength of your lower leg muscles. He or she may tap the side of your lower leg to see if that causes tingling. How is this treated? Treatment for this condition may include: Avoiding activities that make symptoms worse. Using a brace to hold up your foot and toes. Taking anti-inflammatory pain medicines to relieve swelling and lessen pain. Having medicines injected into your ankle joint to lessen pain and swelling. Doing exercises to help you regain or  maintain movement (physical therapy). Surgery to take pressure off the nerve. This may be needed if there is no improvement after 2-3 months or if there is a growth pushing on the nerve. Returning gradually to full activity. Follow these instructions at home: If you have a removable brace: Wear the brace as told by your health care provider. Remove it only as told by your health care provider. Check the skin around the brace every day. Tell your health care provider about any concerns. Loosen the brace if your toes tingle, become numb, or turn cold and blue. Keep the brace clean. If the brace is not waterproof: Do not let it get wet. Cover it with a watertight covering when you take a bath  or a shower. Activity Ask your health care provider when it is safe to drive if you have a brace on your foot. Do not do any activities that make pain or swelling worse. Do exercises as told by your health care provider. Return to your normal activities as told by your health care provider. Ask your health care provider what activities are safe for you. General instructions Take over-the-counter and prescription medicines only as told by your health care provider. Do not put your full weight on your knee until your health care provider says you can. Use crutches as directed by your health care provider. Keep all follow-up visits. This is important. How is this prevented? Wear supportive footwear that is appropriate for your athletic activity. Avoid athletic activities that cause ankle pain or swelling. Wear protective padding over your lower legs when playing contact sports. Make sure your boots do not put extra pressure on the area just below your knees. Do not sit cross-legged for long periods of time. Contact a health care provider if: Your symptoms do not get better in 2-3 months. The weakness or numbness in your leg or foot gets worse. Summary Common peroneal nerve entrapment is a condition that results from pressure on a nerve in the lower leg called the common peroneal nerve. This condition may be caused by a hard hit, swelling, a fracture, or a cyst in the lower leg. Treatment may include rest, a brace, medicines, and physical therapy. Sometimes surgery is needed. Do not do any activities that make pain or swelling worse. This information is not intended to replace advice given to you by your health care provider. Make sure you discuss any questions you have with your health care provider. Document Revised: 04/06/2021 Document Reviewed: 04/06/2021 Elsevier Patient Education  2024 [employer].

## 2023-01-11 NOTE — Progress Notes (Signed)
[facility]    Provider:  Dr Lucia Gaskins Requesting Provider: Eustaquio Boyden, MD Primary Care Provider:  Eden Emms, NP  CC:  right foot drop  HPI:  Barry Perez is a 62 y.o. male here as requested by Eustaquio Boyden, MD for foot drop. 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  He had a bad leg cramp. The next day he was driving and nticed his right foot drop. No inciting events, didn't sleep on it wrong, didn;t sleep on the couch that night, woke up with it. Didn't go to PT. Has slightly improved. No tripping. Not knowing where his foot is, numb on the top, no lossof weight, nothing tight against knee, no recent back pain or radiculopathy. Happened on the left leg years ago and resolved.  He states that there were no inciting events, no trauma, no compressive event, everything was normal, he did not participate in drugs or alcohol and fall asleep on the couch for example, no low back pain or radiculopathy, he has numbness below the knee, no pain at the knee, no recent crossing of his legs or being in 1 position for a long time, symptoms not consistent with a lumbar radiculopathy or stroke, he had a similar issue to the left leg but that was 10 to 20 years ago and resolved.Marland Kitchen  He denies polyneuropathy or diabetic neuropathy.No other focal neurologic deficits, associated symptoms, inciting events or modifiable factors.   Reviewed notes, labs and imaging from outside physicians, which showed:  I reviewed Dr. Jonathon Jordan notes.  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  He denied history of trauma no obvious compressive etiology found, symptoms not consistent with lumbar radiculopathy myelopathy or stroke although he does have a history of left CEA for which she continues aspirin and statin.  He had a history of similar issue to the left leg of sudden weakness but over 15 years ago and remote history of lumbar trauma without symptoms in interim.  They checked B12, CBC, CMP and ANA.  They checked lumbar films.  They discussed MRI lumbar spine versus EMG nerve conduction study.  They recommended avoiding crossing legs in the interim  Dg lumbar spine 11/08/2022: EXAM: LUMBAR SPINE - COMPLETE 4+ VIEW   COMPARISON:  None Available.   FINDINGS: Normal alignment of the lumbar vertebral bodies. Disc spaces and vertebral bodies are maintained. The facets are normally aligned. No pars defects. The visualized bony pelvis is intact.   IMPRESSION: Normal alignment and no acute bony findings or significant degenerative changes.    Recent Results (from the past 2160 hour(s))  Comprehensive metabolic panel     Status: None   Collection Time: 11/08/22  4:30 PM  Result Value Ref Range   Sodium 136 135 - 145 mEq/L   Potassium 3.9 3.5 - 5.1 mEq/L   Chloride 100 96 - 112 mEq/L   CO2 29 19 - 32 mEq/L   Glucose, Bld 76 70 - 99 mg/dL   BUN 10 6 - 23 mg/dL   Creatinine, Ser 5.40 0.40 - 1.50 mg/dL   Total Bilirubin 0.5  0.2 - 1.2 mg/dL   Alkaline Phosphatase 67 39 - 117 U/L   AST 16 0 - 37 U/L   ALT 13 0 - 53 U/L   Total Protein 6.7 6.0 - 8.3 g/dL   Albumin 4.2 3.5 - 5.2 g/dL   GFR 16.10 >96.04 mL/min    Comment: Calculated using the CKD-EPI Creatinine Equation (2021)   Calcium 9.0 8.4 - 10.5 mg/dL  Vitamin V40     Status: None   Collection Time: 11/08/22  4:30 PM  Result Value Ref Range   Vitamin B-12 249 211 - 911 pg/mL  CBC with Differential/Platelet     Status: Abnormal   Collection  Time: 11/08/22  4:30 PM  Result Value Ref Range   WBC 10.3 4.0 - 10.5 K/uL   RBC 4.72 4.22 - 5.81 Mil/uL   Hemoglobin 14.7 13.0 - 17.0 g/dL   HCT 98.1 19.1 - 47.8 %   MCV 90.8 78.0 - 100.0 fl   MCHC 34.3 30.0 - 36.0 g/dL   RDW 29.5 62.1 - 30.8 %   Platelets 255.0 150.0 - 400.0 K/uL   Neutrophils Relative % 67.8 43.0 - 77.0 %   Lymphocytes Relative 20.6 12.0 - 46.0 %   Monocytes Relative 8.3 3.0 - 12.0 %   Eosinophils Relative 1.3 0.0 - 5.0 %   Basophils Relative 2.0 0.0 - 3.0 %   Neutro Abs 7.0 1.4 - 7.7 K/uL   Lymphs Abs 2.1 0.7 - 4.0 K/uL   Monocytes Absolute 0.9 0.1 - 1.0 K/uL   Eosinophils Absolute 0.1 0.0 - 0.7 K/uL   Basophils Absolute 0.2 (H) 0.0 - 0.1 K/uL  ANA     Status: None   Collection Time: 11/13/22  8:38 AM  Result Value Ref Range   Anti Nuclear Antibody (ANA) NEGATIVE NEGATIVE    Comment: ANA IFA is a first line screen for detecting the presence of up to approximately 150 autoantibodies in various autoimmune diseases. A negative ANA IFA result suggests an ANA-associated autoimmune disease is not present at this time, but is not definitive. If there is high clinical suspicion for Sjogren's syndrome, testing for anti-SS-A/Ro antibody should be considered. Anti-Jo-1 antibody should be considered for clinically suspected inflammatory myopathies. . AC-0: Negative . International Consensus on ANA Patterns ([email or web address]) . For additional information, please refer to http://education.[facility].com/faq/FAQ177 (This link is being provided for informational/ educational purposes only.) .       Review of Systems: Patient complains of symptoms per HPI as well as the following symptoms fot weakness. Pertinent negatives and positives per HPI. All others negative.   Social History   Socioeconomic History   Marital status: Single    Spouse name: Not on file   Number of children: 2   Years of education: Not on file   Highest  education level: Bachelor's degree (e.g., BA, AB, BS)  Occupational History   Not on file  Tobacco Use   Smoking status: Every Day    Packs/day: 1.00    Years: 21.00    Additional pack years: 0.00    Total pack years: 21.00    Types: Cigarettes   Smokeless tobacco: Never  Vaping Use   Vaping Use: Never used  Substance and Sexual Activity   Alcohol use: Yes    Alcohol/week: 16.0 standard drinks of alcohol    Types: 6 Cans of beer, 10 Shots of liquor per week    Comment: liqour 1-2 with ice a night   Drug use: Never  Sexual activity: Not Currently  Other Topics Concern   Not on file  Social History Narrative   Originally from [place]    Fulltime: Catering manager.   Social Determinants of Health   Financial Resource Strain: Low Risk  (11/07/2022)   Overall Financial Resource Strain (CARDIA)    Difficulty of Paying Living Expenses: Not very hard  Food Insecurity: Patient Declined (11/07/2022)   Hunger Vital Sign    Worried About Running Out of Food in the Last Year: Patient declined    Ran Out of Food in the Last Year: Patient declined  Transportation Needs: No Transportation Needs (11/07/2022)   PRAPARE - Administrator, Civil Service (Medical): No    Lack of Transportation (Non-Medical): No  Physical Activity: Insufficiently Active (11/07/2022)   Exercise Vital Sign    Days of Exercise per Week: 2 days    Minutes of Exercise per Session: 20 min  Stress: No Stress Concern Present (11/07/2022)   [employer] of Occupational Health - Occupational Stress Questionnaire    Feeling of Stress : Only a little  Social Connections: Unknown (11/07/2022)   Social Connection and Isolation Panel [NHANES]    Frequency of Communication with Friends and Family: More than three times a week    Frequency of Social Gatherings with Friends and Family: Three times a week    Attends Religious Services: Patient declined    Active Member of Clubs or Organizations: No    Attends Museum/gallery exhibitions officer: Not on file    Marital Status: Divorced  Intimate Partner Violence: Not on file    Family History  Problem Relation Age of Onset   Hypertension Mother    Hyperlipidemia Mother    Diabetes Mother    COPD Father    Hyperlipidemia Father    Hypertension Father     Past Medical History:  Diagnosis Date   Allergy Entire life   Anxiety    Depression    Hyperlipidemia    Seizures (HCC)     Patient Active Problem List   Diagnosis Date Noted   Environmental allergies 12/04/2022   Tobacco use 12/04/2022   Acquired right foot drop 11/08/2022   Generalized abdominal pain 03/29/2022   Bowler's thumb of right side 02/21/2022   Essential tremor 12/23/2021   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     atorvastatin (LIPITOR) 20 MG tablet Take 1 tablet (20 mg total) by mouth daily. 90 tablet 3   azelastine (ASTELIN) 0.1 % nasal spray Place 2 sprays into both nostrils 2 (two) times daily. Use in each nostril as directed 30 mL 0   buPROPion (WELLBUTRIN XL) 150 MG 24 hr tablet Take 1 tablet (150 mg total) by mouth daily. 90 tablet 1   eszopiclone 3 MG TABS Take 1 tablet (3 mg total) by mouth at bedtime. Take immediately before bedtime 30 tablet 0   FLUoxetine (PROZAC) 20 MG capsule Take 20 mg by  mouth daily.     levocetirizine (XYZAL) 5 MG tablet Take 1 tablet (5 mg total) by mouth every evening. 90 tablet 3   montelukast (SINGULAIR) 10 MG  tablet Take 1 tablet (10 mg total) by mouth daily. 90 tablet 3   No current facility-administered medications for this visit.    Allergies as of 01/11/2023 - Review Complete 01/11/2023  Allergen Reaction Noted   Erythromycin Nausea And Vomiting and Other (See Comments) 11/23/2011    Vitals: BP (!) 88/57   Pulse 78   Ht 5\' 10"  (1.778 m)   Wt 171 lb (77.6 kg)   BMI 24.54 kg/m  Last Weight:  Wt Readings from Last 1 Encounters:  01/11/23 171 lb (77.6 kg)   Last Height:   Ht Readings from Last 1 Encounters:  01/11/23 5\' 10"  (1.778 m)     Physical exam: Exam: Gen: NAD, conversant, well nourised,  well groomed                     CV: RRR, no MRG. No Carotid Bruits. No peripheral edema, warm, nontender Eyes: Conjunctivae clear without exudates or hemorrhage  Neuro: Detailed Neurologic Exam  Speech:    Speech is normal; fluent and spontaneous with normal comprehension.  Cognition:    The patient is oriented to person, place, and time;     recent and remote memory intact;     language fluent;     normal attention, concentration,     fund of knowledge Cranial Nerves:    The pupils are equal, round, and reactive to light.  Attempted, pupils too small to visualize fundi.  Visual fields are full to finger confrontation. Extraocular movements are intact. Trigeminal sensation is intact and the muscles of mastication are normal. The face is symmetric. The palate elevates in the midline. Hearing intact. Voice is normal. Shoulder shrug is normal. The tongue has normal motion without fasciculations.   Coordination:    No dysmetria or ataxia  Gait: Slight steppage gait.  Cannot walk on right heel. Motor Observation:    No asymmetry, no atrophy, and no involuntary movements noted. Tone:    Normal muscle tone.    Posture:    Posture is normal. normal erect    Strength: Weakness right foot dorsiflexion and right foot eversion but inversion is intact.  Otherwise  Strength is V/V in  the upper and lower limbs.      Sensation: intact to LT     Reflex Exam:  DTR's:    Deep tendon reflexes in the upper and lower extremities are symmetrical bilaterally.   Toes:    The toes are downgoing bilaterally.   Clonus:    Clonus is absent.    Assessment/Plan:  Barry Perez is a 62 y.o. male here as requested by Eustaquio Boyden, MD for foot drop. 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 Will expedite emg/ncs  Orders Placed This Encounter  Procedures   DG Knee 3 Views Right   Ambulatory referral to Physical Therapy   NCV with EMG(electromyography)    Cc: Eustaquio Boyden, MD,  Eden Emms, NP  Naomie Dean, MD  [facility] [address] [place],  [place] [address]  Phone [phone] Fax [phone]

## 2023-01-12 ENCOUNTER — Ambulatory Visit: Payer: BC Managed Care – PPO | Attending: Neurology | Admitting: Physical Therapy

## 2023-01-12 DIAGNOSIS — M6281 Muscle weakness (generalized): Secondary | ICD-10-CM | POA: Insufficient documentation

## 2023-01-12 DIAGNOSIS — G5731 Lesion of lateral popliteal nerve, right lower limb: Secondary | ICD-10-CM | POA: Diagnosis not present

## 2023-01-12 DIAGNOSIS — M21371 Foot drop, right foot: Secondary | ICD-10-CM | POA: Diagnosis not present

## 2023-01-12 DIAGNOSIS — R2689 Other abnormalities of gait and mobility: Secondary | ICD-10-CM | POA: Diagnosis not present

## 2023-01-12 NOTE — Therapy (Signed)
OUTPATIENT PHYSICAL THERAPY LOWER EXTREMITY EVALUATION   Patient Name: Kaemon Sandefer MRN: [id number] DOB:[birth year], 62 y.o., male Today's Date: 01/12/2023  END OF SESSION:  PT End of Session - 01/12/23 0853     Visit Number 1    Number of Visits 5   with eval   Date for PT Re-Evaluation 02/23/23    Authorization Type BC[employer]    PT Start Time [phone]   pt arrived late   PT Stop Time 0925   eval   PT Time Calculation (min) 33 min    Activity Tolerance Patient tolerated treatment well    Behavior During Therapy [facility] for tasks assessed/performed             Past Medical History:  Diagnosis Date   Allergy Entire life   Anxiety    Depression    Hyperlipidemia    Seizures (HCC)    Past Surgical History:  Procedure Laterality Date   CAROTID ENDARTERECTOMY Left 2022   HERNIA REPAIR     Patient Active Problem List   Diagnosis Date Noted   Environmental allergies 12/04/2022   Tobacco use 12/04/2022   Acquired right foot drop 11/08/2022   Generalized abdominal pain 03/29/2022   Bowler's thumb of right side 02/21/2022   Essential tremor 12/23/2021   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 12/23/2021   Anxiety and depression 12/23/2021   Carotid stenosis, asymptomatic, left 12/23/2021   Vitamin D deficiency 12/23/2021   Preventative health care 12/23/2021   Hyperlipidemia 12/23/2021   Other fatigue 12/23/2021   Overweight 12/23/2021   Facial droop 08/23/2020   Insomnia 05/21/2019    PCP: Eden Emms, NP  REFERRING PROVIDER: Anson Fret, MD  REFERRING DIAG: [phone] (ICD-10-CM) - Right foot drop G57.31 (ICD-10-CM) - Peroneal neuropathy at knee, right  THERAPY DIAG:  Muscle weakness (generalized)  Other abnormalities of gait and mobility  Foot drop, right  Rationale for Evaluation and Treatment: Rehabilitation  ONSET DATE: 01/11/2023 (referral date)  SUBJECTIVE:   SUBJECTIVE STATEMENT: Pt reports that close to 3 months ago he had a sudden onset of R foot  drop while driving. Pt reports that since this onset he has had no feeling/sensation in parts of his R foot. Pt reports that driving has been difficult but he has been able to compensate by lifting up his leg (using hip muscles). Pt did see Dr. Lucia Gaskins yesterday and was referred to this clinic for PT, has an EMG scheduled next week (6/12) to see how far this nerve damage has progressed.  Pt notes that he enjoys bowling (is in a bowling league) but has been hesitant to try bowling since onset of this foot drop.  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 No  PRECAUTIONS: Fall  WEIGHT BEARING RESTRICTIONS: No  FALLS:  Has patient fallen in last 6 months? No but 3-4 close falls  LIVING ENVIRONMENT: Lives with: lives alone (daughter moving out soon) Lives in: House/apartment Stairs: Yes: External: 3 steps; on right going up Has following equipment at home: Dan Humphreys - 2 wheeled (hasn't needed it though)  OCCUPATION: desk job  PLOF: Independent with gait and Independent with transfers  PATIENT GOALS:  "to see if I can reverse any of this", goal with Dr. Lucia Gaskins is  to make sure it doesn't get worse  NEXT MD VISIT: 01/17/23 EMG with Dr. Lucia Gaskins at [facility]  OBJECTIVE:   DIAGNOSTIC FINDINGS:  EMG scheduled for 01/17/23  Lumbar MRI ordered but not scheduled (not really necessary as symptoms are related to peroneal nerve)  Lumbar Spine Xray 11/08/22 IMPRESSION: Normal alignment and no acute bony findings or significant degenerative changes.  PATIENT SURVEYS:  LEFS :36/80  COGNITION: Overall cognitive status: Within functional limits for tasks assessed     SENSATION: Impaired along peroneal nerve  distribution of RLE  POSTURE: rounded shoulders and forward head  LOWER EXTREMITY ROM:  Active ROM Right eval Left eval  Hip flexion [facility]  Hip extension    Hip abduction    Hip adduction    Hip internal rotation    Hip external rotation    Knee flexion [facility] WFL  Knee extension [facility] WFL  Ankle dorsiflexion Decreased due to weakness WFL  Ankle plantarflexion    Ankle inversion    Ankle eversion     (Blank rows = not tested)  LOWER EXTREMITY MMT:  MMT Right eval Left eval  Hip flexion 4+ 5  Hip extension    Hip abduction    Hip adduction    Hip internal rotation    Hip external rotation    Knee flexion 5 5  Knee extension 4+ 5  Ankle dorsiflexion 2- 5  Ankle plantarflexion    Ankle inversion    Ankle eversion     (Blank rows = not tested)   FUNCTIONAL TESTS:   [place] PT Assessment - 01/12/23 0912       Ambulation/Gait   Gait velocity 32.8 ft over 8.16 sec = 4.02 ft/sec      Standardized Balance Assessment   Standardized Balance Assessment Timed Up and Go Test;Five Times Sit to Stand    Five times sit to stand comments  18.72 sec   no UE support     Timed Up and Go Test   TUG Normal TUG    Normal TUG (seconds) 7.94   no AD             GAIT: Distance walked: various clinic distances Assistive device utilized: None Level of assistance: Modified independence Comments: mild R foot drop/foot slap noted during gait, pt reports it is worse at the end of the day   TODAY'S TREATMENT:                                                                                                                              PT Evaluation   PATIENT EDUCATION:  Education details: Eval findings, PT POC Person educated: Patient Education method: Explanation Education comprehension: verbalized understanding and needs further education  HOME EXERCISE PROGRAM: To be initiated  ASSESSMENT:  CLINICAL IMPRESSION: Patient is a 62 year old male referred to Neuro OPPT for 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 The following deficits were present during the exam: decreased RLE strength and impaired ability to perform recreational activities. Based on frequency of near falls and R peroneal neuropathy, pt is an increased risk for falls. Pt would benefit from skilled PT to address these impairments and functional limitations to maximize functional mobility independence.   OBJECTIVE IMPAIRMENTS: Abnormal gait, decreased knowledge of condition, difficulty walking, decreased strength, impaired perceived functional ability, and impaired sensation.   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   REHAB POTENTIAL: Good  CLINICAL DECISION MAKING: Stable/uncomplicated  EVALUATION COMPLEXITY: Low   GOALS: Goals reviewed with patient? Yes  SHORT TERM GOALS=LONG TERM GOALS due to length of POC  LONG TERM GOALS: Target date: 02/23/2023  Pt will be independent with final HEP for improved strength, balance, transfers and gait. Baseline: not initiated at eval Goal status: INITIAL  2.  Pt will improve his score on the LEFS to >/= 45/80 to demonstrate improved functional level and ability to return to recreational activities. Baseline:  36/80 (6/7) Goal status: INITIAL  3.  Pt to trial various bracing options to determine best fit to assist with improving overall gait mechanics and decreasing fall risk. Baseline: none assessed at eval Goal status: INITIAL   PLAN:  PT FREQUENCY: 1x/week  PT DURATION:  5 sessions (with eval)  PLANNED INTERVENTIONS: Therapeutic exercises, Therapeutic activity, Neuromuscular re-education, Balance training, Gait training, Patient/Family education, Self Care, Joint mobilization, Stair training, Orthotic/Fit training, DME instructions, Dry Needling, Electrical stimulation, Taping, Manual therapy, and Re-evaluation  PLAN FOR NEXT SESSION: plan to perform NMES to tib ant, tib ant strengthening, foot up brace vs AFO if warranted   Peter Congo, PT, DPT, CSRS 01/12/2023, 9:27 AM

## 2023-01-13 ENCOUNTER — Encounter: Payer: Self-pay | Admitting: Nurse Practitioner

## 2023-01-14 ENCOUNTER — Encounter: Payer: Self-pay | Admitting: Neurology

## 2023-01-17 ENCOUNTER — Ambulatory Visit (INDEPENDENT_AMBULATORY_CARE_PROVIDER_SITE_OTHER): Payer: BC Managed Care – PPO | Admitting: Neurology

## 2023-01-17 ENCOUNTER — Ambulatory Visit (INDEPENDENT_AMBULATORY_CARE_PROVIDER_SITE_OTHER): Payer: Self-pay | Admitting: Neurology

## 2023-01-17 VITALS — BP 151/95 | HR 75 | Ht 70.0 in | Wt 170.0 lb

## 2023-01-17 DIAGNOSIS — G5731 Lesion of lateral popliteal nerve, right lower limb: Secondary | ICD-10-CM

## 2023-01-17 DIAGNOSIS — M21371 Foot drop, right foot: Secondary | ICD-10-CM

## 2023-01-17 DIAGNOSIS — Z0289 Encounter for other administrative examinations: Secondary | ICD-10-CM

## 2023-01-23 ENCOUNTER — Ambulatory Visit: Payer: BC Managed Care – PPO | Admitting: Physical Therapy

## 2023-01-25 NOTE — Progress Notes (Signed)
Full Name: Barry Perez Gender: Male MRN #: [id number] Date of Birth: 02-28-1961    Visit Date: 01/17/2023 14:38 Age: 62 Years Examining Physician: Dr. Naomie Dean Referring Physician: Dr. Naomie Dean Height: 5 feet 10 inch  History: Right foot drop; He had a bad leg cramp. The next day he was driving and nticed his right foot drop. No inciting events, didn't sleep on it wrong, didn;t sleep on the couch that night, just woke up with it. Didn't go to PT. Has slightly improved. No tripping. Also numb on the top. Denies loss of weight, denies having anything tight against knee, no recent back pain or radiculopathy. Happened on the left leg years ago and resolved.  He states that there were no inciting events, no trauma, no compressive event, everything was normal, he did not participate in drugs or alcohol and fall asleep on the couch for example, no low back pain or radiculopathy, he has numbness below the knee, no pain at the knee, no recent crossing of his legs or being in 1 position for a long time, clinical exam not consistent with a lumbar radiculopathy or stroke, he had a similar issue to the left leg but that was 10 to 20 years ago and resolved.Marland Kitchen  He denies polyneuropathy or diabetic neuropathy.No other focal neurologic deficits, associated symptoms, inciting events or modifiable factors.  Summary: The right peroneal EDB motor nerve showed reduced amplitude (0.6 mV, normal greater than 2), decreased conduction velocity fib head to ankle (42 m/s, normal greater than 44 m/s) and decreased conduction velocity pop fossa to fib (38 m/s, normal greater than 44) and a 12 m/s drop across the fibular head (normal less than 10).  The right peroneal tibialis anterior motor nerve showed delayed distal onset latency (7.1 ms, normal less than 4,7) and reduced amplitude (0.5 mV, normal greater than 3).  The right superficial peroneal sensory nerve showed no response.All remaining nerves (as indicated  in the following tables) were within normal limits.  The right tibialis anterior showed polyphasic motor units and diminished motor unit recruitment.  The right peroneus longus muscle showed polyphasic motor units, diminished motor unit recruitment, reduced amplitude of motor units, prolonged motor unit duration. All remaining muscles (as indicated in the following tables) were within normal limits.        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  Chronic neurogenic changes were seen in distal peroneal muscles but no acute ongoing denervation.  No suggestion of polyneuropathy or lumbar radiculopathy.  ------------------------------- Naomie Dean, M.D.  [facility] [address] [place], [place] [address] Tel: [phone] Fax: [phone]  Verbal informed consent was obtained from the patient, patient was informed of potential risk of procedure, including bruising, bleeding, hematoma formation, infection, muscle weakness, muscle pain, numbness, among others.           MNC    Nerve / Sites Muscle Latency Ref. Amplitude Ref. Rel Amp Segments Distance Velocity Ref. Area    ms ms mV mV %  cm m/s m/s mVms  R Peroneal - EDB     Ankle EDB 5.6 ?6.5 0.6 ?2.0 100 Ankle - EDB 9   2.2     Fib head EDB 11.7  0.3  56.4 Fib head - Ankle 25.6 42 ?44 1.4     Pop fossa EDB 16.3  0.1  27.6 Pop fossa - Fib head 14 30 ?44 0.8  Pop fossa - Ankle      L Peroneal - EDB     Ankle EDB 6.5 ?6.5 2.7 ?2.0 100 Ankle - EDB 9   9.7     Fib head EDB 12.3  2.5  90.6 Fib head - Ankle 26 45 ?44 8.4     Pop fossa EDB 15.1  2.9  119 Pop fossa - Fib head 12 43 ?44 12.5         Pop fossa - Ankle      R Tibial - AH     Ankle AH 4.0 ?5.8 15.7 ?4.0 100 Ankle - AH 9   40.2     Pop fossa AH 13.5  10.8  68.9 Pop fossa - Ankle 42 44 ?41 35.4  R Peroneal - Tib Ant     Fib Head Tib Ant 7.1 ?4.7 0.5 ?3.0 100 Fib Head - Tib Ant 10   0.5     Pop  fossa Tib Ant NR  NR  NR Pop fossa - Fib Head   ?44 NR               SNC    Nerve / Sites Rec. Site Peak Lat Ref.  Amp Ref. Segments Distance    ms ms V V  cm  R Sural - Ankle (Calf)     Calf Ankle 3.9 ?4.4 7 ?6 Calf - Ankle 14  R Superficial peroneal - Ankle     Lat leg Ankle NR ?4.4 NR ?6 Lat leg - Ankle 14         H Reflex    Nerve H Lat Lat Hmax   ms ms   Left Right Ref. Left Right Ref.  Tibial - Soleus 33.7 34.4 ?35.0 33.7 32.1 ?35.0          EMG Summary Table    Spontaneous MUAP Recruitment  Muscle IA Fib PSW Fasc Other Amp Dur. Poly Pattern  R. Vastus medialis Normal None None None _______ Normal Normal Normal Normal  R. Tibialis anterior Normal None None None _______ Normal Normal 2+ Reduced  R. Peroneus longus Normal None None None _______ Increased Increased 2+ Reduced  R. Extensor hallucis longus Normal None None None _______ Normal Normal Normal Normal  R. Biceps femoris (long head) Normal None None None _______ Normal Normal Normal Normal  R. Gluteus maximus Normal None None None _______ Normal Normal Normal Normal  R. Gluteus medius Normal None None None _______ Normal Normal Normal Normal  R. Lumbar paraspinals (low) Normal None None None _______ Normal Normal Normal Normal

## 2023-01-27 ENCOUNTER — Encounter: Payer: Self-pay | Admitting: Neurology

## 2023-01-27 NOTE — Patient Instructions (Signed)
      Common Peroneal Nerve Entrapment  Common peroneal nerve entrapment is a condition that can make it hard to lift a foot. The condition results from pressure on a nerve in the lower leg called the common peroneal nerve. Your common peroneal nerve provides feeling to your outer lower leg and foot. It also supplies the muscles that move your foot and toes upward and outward. What are the causes? This condition may be caused by: Sitting cross-legged, squatting, or kneeling for long periods of time. A hard, direct hit to the outside of the lower leg. Swelling from a knee injury. A break or fracture in one of the lower leg bones. Wearing a boot or cast that ends just below the knee. A growth or cyst near the nerve. What increases the risk? This condition is more likely to develop in people who play: Contact sports, such as football or hockey. Sports where you wear high and stiff boots, such as skiing. What are the signs or symptoms? Symptoms of this condition include: Trouble lifting your foot up (foot drop). Tripping often. Your foot hitting the ground harder than normal as you walk, resulting in a slapping-type sound. Numbness, tingling, or pain in the outside of the knee, outside of the lower leg, and top of the foot. Sensitivity to pressure on the front or outside of the leg. How is this diagnosed? This condition may be diagnosed based on: Your symptoms. Your medical history. A physical exam. Tests, such as: X-rays to check the bones of your knee and leg. MRI to check tendons that attach to the side of your knee. Ultrasound to check for a growth or cyst. Electromyogram (EMG) to check your nerves. During your physical exam, your health care provider will check for numbness in your leg and test the strength of your lower leg muscles. He or she may tap the side of your lower leg to see if that causes tingling. How is this treated? Treatment for this condition may  include: Avoiding activities that make symptoms worse. Using a brace to hold up your foot and toes. Taking anti-inflammatory pain medicines to relieve swelling and lessen pain. Having medicines injected into your ankle joint to lessen pain and swelling. Doing exercises to help you regain or maintain movement (physical therapy). Surgery to take pressure off the nerve. This may be needed if there is no improvement after 2-3 months or if there is a growth pushing on the nerve. Returning gradually to full activity. Follow these instructions at home: If you have a removable brace: Wear the brace as told by your health care provider. Remove it only as told by your health care provider. Check the skin around the brace every day. Tell your health care provider about any concerns. Loosen the brace if your toes tingle, become numb, or turn cold and blue. Keep the brace clean. If the brace is not waterproof: Do not let it get wet. Cover it with a watertight covering when you take a bath or a shower. Activity Ask your health care provider when it is safe to drive if you have a brace on your foot. Do not do any activities that make pain or swelling worse. Do exercises as told by your health care provider. Return to your normal activities as told by your health care provider. Ask your health care provider what activities are safe for you. General instructions Take over-the-counter and prescription medicines only as told by your health care provider. Do not   knee until your health care provider says you can. Use crutches as directed by your health care provider. Keep all follow-up visits. This is important. How is this prevented? Wear supportive footwear that is appropriate for your athletic activity. Avoid athletic activities that cause ankle pain or swelling. Wear protective padding over your lower legs when playing contact sports. Make sure your boots do not put extra pressure on the area  just below your knees. Do not sit cross-legged for long periods of time. Contact a health care provider if: Your symptoms do not get better in 2-3 months. The weakness or numbness in your leg or foot gets worse. Summary Common peroneal nerve entrapment is a condition that results from pressure on a nerve in the lower leg called the common peroneal nerve. This condition may be caused by a hard hit, swelling, a fracture, or a cyst in the lower leg. Treatment may include rest, a brace, medicines, and physical therapy. Sometimes surgery is needed. Do not do any activities that make pain or swelling worse. This information is not intended to replace advice given to you by your health care provider. Make sure you discuss any questions you have with your health care provider. Document Revised: 04/06/2021 Document Reviewed: 04/06/2021 Elsevier Patient Education  2024 [employer].

## 2023-01-27 NOTE — Procedures (Signed)
      Full Name: Barry Perez Gender: Male MRN #: [id number] Date of Birth: 07/03/1961    Visit Date: 01/17/2023 14:38 Age: 61 Years Examining Physician: Dr. Jacquelina Hewins Referring Physician: Dr. Kiandria Clum Height: 5 feet 10 inch  History: Right foot drop; He had a bad leg cramp. The next day he was driving and nticed his right foot drop. No inciting events, didn't sleep on it wrong, didn;t sleep on the couch that night, just woke up with it. Didn't go to PT. Has slightly improved. No tripping. Also numb on the top. Denies loss of weight, denies having anything tight against knee, no recent back pain or radiculopathy. Happened on the left leg years ago and resolved.  He states that there were no inciting events, no trauma, no compressive event, everything was normal, he did not participate in drugs or alcohol and fall asleep on the couch for example, no low back pain or radiculopathy, he has numbness below the knee, no pain at the knee, no recent crossing of his legs or being in 1 position for a long time, clinical exam not consistent with a lumbar radiculopathy or stroke, he had a similar issue to the left leg but that was 10 to 20 years ago and resolved..  He denies polyneuropathy or diabetic neuropathy.No other focal neurologic deficits, associated symptoms, inciting events or modifiable factors.  Summary: The right peroneal EDB motor nerve showed reduced amplitude (0.6 mV, normal greater than 2), decreased conduction velocity fib head to ankle (42 m/s, normal greater than 44 m/s) and decreased conduction velocity pop fossa to fib (38 m/s, normal greater than 44) and a 12 m/s drop across the fibular head (normal less than 10).  The right peroneal tibialis anterior motor nerve showed delayed distal onset latency (7.1 ms, normal less than 4,7) and reduced amplitude (0.5 mV, normal greater than 3).  The right superficial peroneal sensory nerve showed no response.All remaining nerves (as indicated  in the following tables) were within normal limits.  The right tibialis anterior showed polyphasic motor units and diminished motor unit recruitment.  The right peroneus longus muscle showed polyphasic motor units, diminished motor unit recruitment, reduced amplitude of motor units, prolonged motor unit duration. All remaining muscles (as indicated in the following tables) were within normal limits.        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  Chronic neurogenic changes were seen in distal peroneal muscles but no acute ongoing denervation.  No suggestion of polyneuropathy or lumbar radiculopathy.  ------------------------------- Dalinda Heidt, M.D.  [facility] [address], [address] [place], [place] [address] Tel: [phone] Fax: [phone]  Verbal informed consent was obtained from the patient, patient was informed of potential risk of procedure, including bruising, bleeding, hematoma formation, infection, muscle weakness, muscle pain, numbness, among others.           MNC    Nerve / Sites Muscle Latency Ref. Amplitude Ref. Rel Amp Segments Distance Velocity Ref. Area    ms ms mV mV %  cm m/s m/s mVms  R Peroneal - EDB     Ankle EDB 5.6 ?6.5 0.6 ?2.0 100 Ankle - EDB 9   2.2     Fib head EDB 11.7  0.3  56.4 Fib head - Ankle 25.6 42 ?44 1.4     Pop fossa EDB 16.3  0.1  27.6 Pop fossa - Fib head 14 30 ?44 0.8           Pop fossa - Ankle      L Peroneal - EDB     Ankle EDB 6.5 ?6.5 2.7 ?2.0 100 Ankle - EDB 9   9.7     Fib head EDB 12.3  2.5  90.6 Fib head - Ankle 26 45 ?44 8.4     Pop fossa EDB 15.1  2.9  119 Pop fossa - Fib head 12 43 ?44 12.5         Pop fossa - Ankle      R Tibial - AH     Ankle AH 4.0 ?5.8 15.7 ?4.0 100 Ankle - AH 9   40.2     Pop fossa AH 13.5  10.8  68.9 Pop fossa - Ankle 42 44 ?41 35.4  R Peroneal - Tib Ant     Fib Head Tib Ant 7.1 ?4.7 0.5 ?3.0 100 Fib Head - Tib Ant 10   0.5     Pop  fossa Tib Ant NR  NR  NR Pop fossa - Fib Head   ?44 NR               SNC    Nerve / Sites Rec. Site Peak Lat Ref.  Amp Ref. Segments Distance    ms ms V V  cm  R Sural - Ankle (Calf)     Calf Ankle 3.9 ?4.4 7 ?6 Calf - Ankle 14  R Superficial peroneal - Ankle     Lat leg Ankle NR ?4.4 NR ?6 Lat leg - Ankle 14         H Reflex    Nerve H Lat Lat Hmax   ms ms   Left Right Ref. Left Right Ref.  Tibial - Soleus 33.7 34.4 ?35.0 33.7 32.1 ?35.0          EMG Summary Table    Spontaneous MUAP Recruitment  Muscle IA Fib PSW Fasc Other Amp Dur. Poly Pattern  R. Vastus medialis Normal None None None _______ Normal Normal Normal Normal  R. Tibialis anterior Normal None None None _______ Normal Normal 2+ Reduced  R. Peroneus longus Normal None None None _______ Increased Increased 2+ Reduced  R. Extensor hallucis longus Normal None None None _______ Normal Normal Normal Normal  R. Biceps femoris (long head) Normal None None None _______ Normal Normal Normal Normal  R. Gluteus maximus Normal None None None _______ Normal Normal Normal Normal  R. Gluteus medius Normal None None None _______ Normal Normal Normal Normal  R. Lumbar paraspinals (low) Normal None None None _______ Normal Normal Normal Normal   

## 2023-02-06 ENCOUNTER — Encounter: Payer: Self-pay | Admitting: Physical Therapy

## 2023-02-06 ENCOUNTER — Ambulatory Visit: Payer: BC Managed Care – PPO | Attending: Neurology | Admitting: Physical Therapy

## 2023-02-06 DIAGNOSIS — M21371 Foot drop, right foot: Secondary | ICD-10-CM | POA: Diagnosis not present

## 2023-02-06 DIAGNOSIS — R2689 Other abnormalities of gait and mobility: Secondary | ICD-10-CM | POA: Diagnosis not present

## 2023-02-06 DIAGNOSIS — M6281 Muscle weakness (generalized): Secondary | ICD-10-CM | POA: Diagnosis not present

## 2023-02-06 NOTE — Therapy (Signed)
OUTPATIENT PHYSICAL THERAPY APPOINTMENT   Patient Name: Gill Sargis MRN: [id number] DOB:1960/10/28, 62 y.o., male Today's Date: 02/06/2023  END OF SESSION:  PT End of Session - 02/06/23 1626     Visit Number 2    Number of Visits 5    Date for PT Re-Evaluation 02/23/23    Authorization Type BCBS    PT Start Time 1626   patient arrives late to session   PT Stop Time 1700    PT Time Calculation (min) 34 min    Equipment Utilized During Treatment Gait belt    Activity Tolerance Patient tolerated treatment well    Behavior During Therapy WFL for tasks assessed/performed             Past Medical History:  Diagnosis Date   Allergy Entire life   Anxiety    Depression    Hyperlipidemia    Seizures (HCC)    Past Surgical History:  Procedure Laterality Date   CAROTID ENDARTERECTOMY Left 2022   HERNIA REPAIR     Patient Active Problem List   Diagnosis Date Noted   Environmental allergies 12/04/2022   Tobacco use 12/04/2022   Acquired right foot drop 11/08/2022   Generalized abdominal pain 03/29/2022   Bowler's thumb of right side 02/21/2022   Essential tremor 12/23/2021   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 12/23/2021   Anxiety and depression 12/23/2021   Carotid stenosis, asymptomatic, left 12/23/2021   Vitamin D deficiency 12/23/2021   Preventative health care 12/23/2021   Hyperlipidemia 12/23/2021   Other fatigue 12/23/2021   Overweight 12/23/2021   Facial droop 08/23/2020   Insomnia 05/21/2019    PCP: Eden Emms, NP  REFERRING PROVIDER: Anson Fret, MD  REFERRING DIAG: [phone] (ICD-10-CM) - Right foot drop G57.31 (ICD-10-CM) - Peroneal neuropathy at knee, right  THERAPY DIAG:  Muscle weakness (generalized)  Other abnormalities of gait and mobility  Foot drop, right  Rationale for Evaluation and Treatment: Rehabilitation  ONSET DATE: 01/11/2023 (referral date)  SUBJECTIVE:   SUBJECTIVE STATEMENT: Pt reports that his nerve results  are in showing suspected common peroneal nerve entrapment. Denies falls/near falls. States he has new onset swelling on the inside part of his R foot that started yesterday. Reports no known cause just that he woke up this way.   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  Reports point tender pain moderate level along medial aspect of R foot, sharp when pressed otherwise achy, hurts more with pressure or walking, improves with rest  PRECAUTIONS: Fall  WEIGHT BEARING RESTRICTIONS: No  FALLS:  Has patient fallen in last 6 months? No but 3-4 close falls  LIVING ENVIRONMENT: Lives with: lives alone (daughter moving out soon) Lives in: House/apartment Stairs: Yes: External: 3 steps; on right going up Has following equipment at home: Dan Humphreys - 2 wheeled (hasn't needed it though)  OCCUPATION: desk job  PLOF: Independent with gait and Independent with transfers  PATIENT GOALS:  "to see if I can reverse any of this", goal with Dr. Lucia Gaskins is to make sure it doesn't get worse  NEXT MD VISIT: 01/17/23 EMG with Dr. Lucia [facility]askins at [facility]  OBJECTIVE:   DIAGNOSTIC FINDINGS:  EMG scheduled for 01/17/23  Lumbar MRI ordered but not scheduled (not  really necessary as symptoms are related to peroneal nerve)  Lumbar Spine Xray 11/08/22 IMPRESSION: Normal alignment and no acute bony findings or significant degenerative changes.  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  Chronic neurogenic changes were seen in distal peroneal muscles but no acute ongoing denervation.  No suggestion of polyneuropathy or lumbar radiculopathy.   TODAY'S TREATMENT:                                                                                                                                Self Care:  Initiated session with assessment of point specific pain along medial aspect of foot. Patient doffs socks showing 6 centimeter in diameter red circular area with swelling and red surface over navicular bone. Reports point tenderness to the center of area. Low to mod pain reported with ambulation.Therapist dons gloves for assessment. No open areas noted. Recommended patient follow up with PCP for second opinion. Discussed possibility of gait compensation contributing to pain in this area; however, recommend further evaluation.   Gait:   Foot Up Brace Trial on RLE Comparison to without brace GAIT: Gait pattern: wide BOS and poor foot clearance- Right Distance walked: 1 x 230' Assistive device utilized: None Level of assistance: Modified independence Comments: Patient demonstrates increased leading with medial part of foot through step pattern without use of brace, could explain new swelling and irritation of foot, reduced foot clearance/forefoot contact on this side  GAIT: Gait pattern: wide BOS Distance walked: 1 x 230' Assistive device utilized: R footup brace on the RLE Level of assistance: Modified independence Comments: Improves foot clearance and patient leading with toes through gait cycle  PATIENT EDUCATION:  Education details: foot up brace trial consideration + follow up with PCP Person educated: Patient Education method: Explanation Education comprehension: verbalized understanding and needs further education  HOME EXERCISE PROGRAM: To be initiated  ASSESSMENT:  CLINICAL IMPRESSION: Session worked assessment of new onset medial swelling along R foot near navicular bone in addition to foot up trial. Recommended patient reach out to PCP for second opinion. Possible gait compensations could be contributing to swelling but advised follow up as  exact origin unknown. Also trialed foot up brace which does mildly improve gait mechanics. Patient to consider and may trial in future sessions in order to make decision about pursuing. Continue POC.   OBJECTIVE IMPAIRMENTS: Abnormal gait, decreased knowledge of condition, difficulty walking, decreased strength, impaired perceived functional ability, and impaired sensation.   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   REHAB POTENTIAL: Good  CLINICAL DECISION  MAKING: Stable/uncomplicated  EVALUATION COMPLEXITY: Low   GOALS: Goals reviewed with patient? Yes  SHORT TERM GOALS=LONG TERM GOALS due to length of POC  LONG TERM GOALS: Target date: 02/23/2023  Pt will be independent with final HEP for improved strength, balance, transfers and gait. Baseline: not initiated at eval Goal status: INITIAL  2.  Pt will improve his score on the LEFS to >/= 45/80 to demonstrate improved functional level and ability to return to recreational activities. Baseline: 36/80 (6/7) Goal status: INITIAL  3.  Pt to trial various bracing options to determine best fit to assist with improving overall gait mechanics and decreasing fall risk. Baseline: none assessed at eval Goal status: INITIAL   PLAN:  PT FREQUENCY: 1x/week  PT DURATION:  5 sessions (with eval)  PLANNED INTERVENTIONS: Therapeutic exercises, Therapeutic activity, Neuromuscular re-education, Balance training, Gait training, Patient/Family education, Self Care, Joint mobilization, Stair training,  Orthotic/Fit training, DME instructions, Dry Needling, Electrical stimulation, Taping, Manual therapy, and Re-evaluation  PLAN FOR NEXT SESSION: plan to perform NMES to tib ant, tib ant strengthening, foot up brace vs AFO if warranted, follow up about R medial foot swelling   Carmelia Bake, PT, DPT 02/06/2023, 5:34 PM

## 2023-02-13 ENCOUNTER — Ambulatory Visit: Payer: BC Managed Care – PPO | Admitting: Physical Therapy

## 2023-02-13 DIAGNOSIS — M6281 Muscle weakness (generalized): Secondary | ICD-10-CM

## 2023-02-13 DIAGNOSIS — M21371 Foot drop, right foot: Secondary | ICD-10-CM | POA: Diagnosis not present

## 2023-02-13 DIAGNOSIS — R2689 Other abnormalities of gait and mobility: Secondary | ICD-10-CM | POA: Diagnosis not present

## 2023-02-13 DIAGNOSIS — F411 Generalized anxiety disorder: Secondary | ICD-10-CM | POA: Diagnosis not present

## 2023-02-13 DIAGNOSIS — F331 Major depressive disorder, recurrent, moderate: Secondary | ICD-10-CM | POA: Diagnosis not present

## 2023-02-13 NOTE — Therapy (Signed)
OUTPATIENT PHYSICAL THERAPY APPOINTMENT   Patient Name: Barry Perez MRN: [id number] DOB:September 15, 1960, 62 y.o., male Today's Date: 02/13/2023  END OF SESSION:  PT End of Session - 02/13/23 1618     Visit Number 3    Number of Visits 5    Date for PT Re-Evaluation 02/23/23    Authorization Type BCBS    PT Start Time 1617   pt arrived late   PT Stop Time 1655    PT Time Calculation (min) 38 min    Equipment Utilized During Treatment --    Activity Tolerance Patient tolerated treatment well    Behavior During Therapy WFL for tasks assessed/performed              Past Medical History:  Diagnosis Date   Allergy Entire life   Anxiety    Depression    Hyperlipidemia    Seizures (HCC)    Past Surgical History:  Procedure Laterality Date   CAROTID ENDARTERECTOMY Left 2022   HERNIA REPAIR     Patient Active Problem List   Diagnosis Date Noted   Environmental allergies 12/04/2022   Tobacco use 12/04/2022   Acquired right foot drop 11/08/2022   Generalized abdominal pain 03/29/2022   Bowler's thumb of right side 02/21/2022   Essential tremor 12/23/2021   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 12/23/2021   Anxiety and depression 12/23/2021   Carotid stenosis, asymptomatic, left 12/23/2021   Vitamin D deficiency 12/23/2021   Preventative health care 12/23/2021   Hyperlipidemia 12/23/2021   Other fatigue 12/23/2021   Overweight 12/23/2021   Facial droop 08/23/2020   Insomnia 05/21/2019    PCP: Eden Emms, NP  REFERRING PROVIDER: Anson Fret, MD  REFERRING DIAG: [phone] (ICD-10-CM) - Right foot drop G57.31 (ICD-10-CM) - Peroneal neuropathy at knee, right  THERAPY DIAG:  Muscle weakness (generalized)  Other abnormalities of gait and mobility  Foot drop, right  Rationale for Evaluation and Treatment: Rehabilitation  ONSET DATE: 01/11/2023 (referral date)  SUBJECTIVE:   SUBJECTIVE STATEMENT: Pt asking about where to get his R knee xray done,  provided information based on address in chart. Pt reports that pain and swelling in his R medial foot has resolved.  Pt reports that plan is to have R knee x-rayed because there is damage/compression on his R peroneal nerve. Pt reports that depending on what the xray shows he may have surgery to fix the obstruction but the nerve may or may not recover. Pt's other muscles and nerves in his R shin are compensating for this weakness and helping him to lift his foot and they are becoming damaged from being over worked.  Pt was able to play 18 holes of golf several times while in [place] visiting his son, did this without any falls though he did have one near fall. Pt has still avoided trying bowling yet  Pt reports he doesn't feel like he needs the foot up brace right now because he isn't tripping.  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  Reports point tender pain moderate level along medial aspect of R foot, sharp when pressed otherwise achy, hurts more with pressure or walking, improves with rest  PRECAUTIONS: Fall  WEIGHT BEARING RESTRICTIONS: No  FALLS:  Has patient  fallen in last 6 months? No but 3-4 close falls  LIVING ENVIRONMENT: Lives with: lives alone (daughter moving out soon) Lives in: House/apartment Stairs: Yes: External: 3 steps; on right going up Has following equipment at home: Dan Humphreys - 2 wheeled (hasn't needed it though)  OCCUPATION: desk job  PLOF: Independent with gait and Independent with transfers  PATIENT GOALS:  "to see if I can reverse any of this", goal with Dr. Lucia Gaskins is to make sure it doesn't get worse  NEXT MD VISIT: 01/17/23 EMG with Dr. Lucia Gaskins at  [facility]  OBJECTIVE:   DIAGNOSTIC FINDINGS:  EMG scheduled for 01/17/23  Lumbar MRI ordered but not scheduled (not really necessary as symptoms are related to peroneal nerve)  Lumbar Spine Xray 11/08/22 IMPRESSION: Normal alignment and no acute bony findings or significant degenerative changes.  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  Chronic neurogenic changes were seen in distal peroneal muscles but no acute ongoing denervation.  No suggestion of polyneuropathy or lumbar radiculopathy.   TODAY'S TREATMENT:                                                                                                                               TherEx Standing heel/toe raises x 15 reps B Seated ankle pumps x 20 reps Seated resisted PF with RTB x 15 reps, focus on eccentric DF when returning to neutral Seated heel/toe raises x 15 reps B  Added to HEP, see bolded below    PATIENT EDUCATION:  Education details: initiated HEP, discussed PT POC for next session Person educated: Patient Education method: Explanation, Demonstration, and Handouts Education comprehension: verbalized understanding, returned demonstration, and needs further education  HOME EXERCISE PROGRAM: Access Code: [email or web address] URL: [email or web address] Date: 02/13/2023 Prepared by: Peter Congo  Exercises - Standing Heel Raises  - 1 x daily - 7 x weekly - 3 sets - 10 reps - Standing Toe Raises at Chair  - 1 x daily - 7 x weekly - 3 sets - 10 reps - Long Sitting Ankle Pumps  - 1 x daily - 7 x weekly - 2 sets - 15 reps - Seated Heel Toe Raises  - 1 x daily - 7 x weekly - 3 sets - 10 reps - Ankle and Toe Plantarflexion with Resistance  - 1 x daily - 7 x weekly - 3 sets - 10 reps  ASSESSMENT:  CLINICAL IMPRESSION: Emphasis of skilled PT session on initiating HEP for R ankle muscle strengthening. Pt with decreased R ankle DF strength as compared to  his left and decreased DF strength as compared to his PF, to be expected with peroneal nerve compression. Pt agreeable to trial NMES next session as well as try to simulate bowling to see if he would be safe/able to return to this activity. Continue POC.   OBJECTIVE IMPAIRMENTS: Abnormal gait, decreased knowledge of condition, difficulty walking, decreased strength, impaired perceived  functional ability, and impaired sensation.   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   REHAB POTENTIAL: Good  CLINICAL DECISION MAKING: Stable/uncomplicated  EVALUATION COMPLEXITY: Low   GOALS: Goals reviewed with patient? Yes  SHORT TERM GOALS=LONG TERM GOALS due to length of POC  LONG TERM GOALS: Target date: 02/23/2023  Pt will be independent with final HEP for improved strength, balance, transfers and gait. Baseline: not initiated at eval Goal status: INITIAL  2.  Pt will improve his score on the LEFS to >/= 45/80 to demonstrate improved functional level and ability to return to recreational activities. Baseline: 36/80 (6/7) Goal status: INITIAL  3.  Pt to trial various bracing options to determine best fit to assist with improving overall gait mechanics and decreasing fall risk. Baseline: none assessed at eval Goal status: INITIAL   PLAN:  PT FREQUENCY: 1x/week  PT DURATION:  5 sessions (with eval)  PLANNED INTERVENTIONS: Therapeutic exercises, Therapeutic activity, Neuromuscular re-education, Balance training, Gait training, Patient/Family  education, Self Care, Joint mobilization, Stair training, Orthotic/Fit training, DME instructions, Dry Needling, Electrical stimulation, Taping, Manual therapy, and Re-evaluation  PLAN FOR NEXT SESSION: plan to perform NMES to tib ant, tib ant strengthening, did he schedule R knee xray? work towards movements he would need to do to return to bowling, trial NMES   Peter Congo, PT, DPT, CSRS  02/13/2023, 4:56 PM

## 2023-02-15 ENCOUNTER — Encounter: Payer: Self-pay | Admitting: Nurse Practitioner

## 2023-02-19 NOTE — Telephone Encounter (Signed)
Spoke with patient. He states he was given a list of places to go for xray but he didn't know which one. I advised the order was placed directly for [facility] at [address]. He verbalized understanding and said he would go tomorrow morning. I provided him with the name, address, phone number, and hours of operation. He states his foot drop is not better. He states he has been going to physical therapy and is getting ready to have his 4th appointment. He has been doing the exercises he has learned. He states the goal of PT is to strengthen his muscles. I told him we will notify him with the xray results once we have them. He was appreciative.

## 2023-02-20 ENCOUNTER — Ambulatory Visit: Payer: BC Managed Care – PPO | Admitting: Physical Therapy

## 2023-02-20 DIAGNOSIS — M6281 Muscle weakness (generalized): Secondary | ICD-10-CM | POA: Diagnosis not present

## 2023-02-20 DIAGNOSIS — R2689 Other abnormalities of gait and mobility: Secondary | ICD-10-CM | POA: Diagnosis not present

## 2023-02-20 DIAGNOSIS — M21371 Foot drop, right foot: Secondary | ICD-10-CM | POA: Diagnosis not present

## 2023-02-20 NOTE — Therapy (Signed)
OUTPATIENT PHYSICAL THERAPY APPOINTMENT   Patient Name: Barry Perez MRN: [id number] DOB:10/02/1960, 62 y.o., male Today's Date: 02/20/2023  END OF SESSION:  PT End of Session - 02/20/23 1622     Visit Number 4    Number of Visits 5    Date for PT Re-Evaluation 02/23/23    Authorization Type BCBS    PT Start Time 1622   pt arrived late   PT Stop Time 1652   pt done with treatment   PT Time Calculation (min) 30 min    Equipment Utilized During Treatment Gait belt    Activity Tolerance Patient tolerated treatment well    Behavior During Therapy WFL for tasks assessed/performed               Past Medical History:  Diagnosis Date   Allergy Entire life   Anxiety    Depression    Hyperlipidemia    Seizures (HCC)    Past Surgical History:  Procedure Laterality Date   CAROTID ENDARTERECTOMY Left 2022   HERNIA REPAIR     Patient Active Problem List   Diagnosis Date Noted   Environmental allergies 12/04/2022   Tobacco use 12/04/2022   Acquired right foot drop 11/08/2022   Generalized abdominal pain 03/29/2022   Bowler's thumb of right side 02/21/2022   Essential tremor 12/23/2021   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 12/23/2021   Anxiety and depression 12/23/2021   Carotid stenosis, asymptomatic, left 12/23/2021   Vitamin D deficiency 12/23/2021   Preventative health care 12/23/2021   Hyperlipidemia 12/23/2021   Other fatigue 12/23/2021   Overweight 12/23/2021   Facial droop 08/23/2020   Insomnia 05/21/2019    PCP: Eden Emms, NP  REFERRING PROVIDER: Anson Fret, MD  REFERRING DIAG: [phone] (ICD-10-CM) - Right foot drop G57.31 (ICD-10-CM) - Peroneal neuropathy at knee, right  THERAPY DIAG:  Muscle weakness (generalized)  Other abnormalities of gait and mobility  Foot drop, right  Rationale for Evaluation and Treatment: Rehabilitation  ONSET DATE: 01/11/2023 (referral date)  SUBJECTIVE:   SUBJECTIVE STATEMENT: Pt reports no acute  changes since last visit, no pain today. No return of swelling in R ankle. Pt reports he worked on his HEP 3/7 days since last visit. Pt plans to go by to get his R knee xray after PT today.  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  Reports point tender pain moderate level along medial aspect of R foot, sharp when pressed otherwise achy, hurts more with pressure or walking, improves with rest  PRECAUTIONS: Fall  WEIGHT BEARING RESTRICTIONS: No  FALLS:  Has patient fallen in last 6 months? No but 3-4 close falls  LIVING ENVIRONMENT: Lives with: lives alone (daughter moving out soon) Lives in: House/apartment Stairs: Yes: External: 3 steps; on right going up Has following equipment at home: Dan Humphreys - 2 wheeled (hasn't needed it though)  OCCUPATION: desk job  PLOF: Independent with gait and Independent with transfers  PATIENT GOALS:  "to see if I can reverse any of this", goal with Dr. Lucia Gaskins is to make sure it doesn't get worse  NEXT MD VISIT: 01/17/23 EMG with Dr. Lucia Gaskins at [facility]  OBJECTIVE:   DIAGNOSTIC FINDINGS:  EMG scheduled for 01/17/23  Lumbar MRI ordered but  not scheduled (not really necessary as symptoms are related to peroneal nerve)  Lumbar Spine Xray 11/08/22 IMPRESSION: Normal alignment and no acute bony findings or significant degenerative changes.  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  Chronic neurogenic changes were seen in distal peroneal muscles but no acute ongoing denervation.  No suggestion of polyneuropathy or lumbar radiculopathy.   TODAY'S TREATMENT:                                                                                                                                 NMR/E-Stim NMES level 29 to R tibialis anterior x 10 min. Focus on performing ankle pumps while NMES running to work on strengthening of tib ant muscle.  Gait following use of e-stim x 115 ft with no AD at mod I level. Pt does exhibit improved R heel strike and ankle DF during gait following use of e-stim. Pt able to simulate steps he would take in order to bowl x 3 reps with no LOB and no catching of R foot noted. However, question whether once pt is fatigued he would again have onset of foot drop and may benefit from use of foot-up brace in this instance. Pt to try bowling before return to next session and see how it goes.   PATIENT EDUCATION:  Education details: continue HEP, purpose of e-stim, discussed PT POC for next session Person educated: Patient Education method: Explanation and Demonstration Education comprehension: verbalized understanding, returned demonstration, and needs further education  HOME EXERCISE PROGRAM: Access Code: [email or web address] URL: [email or web address] Date: 02/13/2023 Prepared by: Peter Congo  Exercises - Standing Heel Raises  - 1 x daily - 7 x weekly - 3 sets - 10 reps - Standing Toe Raises at Chair  - 1 x daily - 7 x weekly - 3 sets - 10 reps - Long Sitting Ankle Pumps  - 1 x daily - 7 x weekly - 2 sets - 15 reps - Seated Heel Toe Raises  - 1 x daily - 7 x weekly - 3 sets - 10 reps - Ankle and Toe Plantarflexion with Resistance  - 1 x daily - 7 x weekly - 3 sets - 10 reps  ASSESSMENT:  CLINICAL IMPRESSION: Emphasis of skilled PT session on initiating NMES to R tib ant muscle to work on strengthening of DF musculature. Pt with good response to e-stim this session with increased ROM and ability to perform active DF with use of NMES. Pt could benefit from trial if Bioness in a future session for use of e-stim in a functional setting. Pt continues to benefit from  skilled therapy services to work on trialing Bioness and continued education regarding his diagnosis. Continue POC.   OBJECTIVE IMPAIRMENTS: Abnormal gait, decreased knowledge of condition, difficulty walking, decreased strength, impaired perceived functional ability, and impaired sensation.   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   REHAB POTENTIAL: Good  CLINICAL DECISION MAKING: Stable/uncomplicated  EVALUATION COMPLEXITY: Low   GOALS: Goals reviewed with patient? Yes  SHORT TERM GOALS=LONG TERM GOALS due to length of POC  LONG TERM GOALS: Target date: 02/23/2023  Pt will be independent with final HEP for improved strength, balance, transfers and gait. Baseline: not initiated at eval Goal status: INITIAL  2.  Pt will improve his score on the LEFS to >/= 45/80 to demonstrate improved functional level and ability to return to recreational activities. Baseline: 36/80 (6/7) Goal status: INITIAL  3.  Pt to trial various bracing options to determine best fit to assist with improving overall gait mechanics and decreasing fall risk. Baseline: none assessed at eval Goal status: INITIAL   PLAN:  PT FREQUENCY: 1x/week  PT DURATION:  5 sessions (with eval)  PLANNED INTERVENTIONS: Therapeutic exercises, Therapeutic activity, Neuromuscular re-education, Balance training, Gait training, Patient/Family education, Self Care, Joint mobilization, Stair training, Orthotic/Fit training, DME instructions, Dry Needling, Electrical stimulation, Taping,  Manual therapy, and Re-evaluation  PLAN FOR NEXT SESSION: did he have R knee xray?, reassess LEFS and try Bioness, d/c?   Peter Congo, PT, DPT, CSRS  02/20/2023, 4:55 PM

## 2023-02-27 ENCOUNTER — Ambulatory Visit: Payer: BC Managed Care – PPO | Admitting: Physical Therapy

## 2023-02-27 DIAGNOSIS — M6281 Muscle weakness (generalized): Secondary | ICD-10-CM | POA: Diagnosis not present

## 2023-02-27 DIAGNOSIS — R2689 Other abnormalities of gait and mobility: Secondary | ICD-10-CM

## 2023-02-27 DIAGNOSIS — M21371 Foot drop, right foot: Secondary | ICD-10-CM | POA: Diagnosis not present

## 2023-02-27 NOTE — Therapy (Signed)
[facility] OUTPATIENT PHYSICAL THERAPY APPOINTMENT - RECERTIFICATION AND DISCHARGE   Patient Name: Barry Perez MRN: [id number] DOB:1961/07/17, 62 y.o., male Today's Date: 02/27/2023  PHYSICAL THERAPY DISCHARGE SUMMARY  Visits from Start of Care: 5  Current functional level related to goals / functional outcomes: Mod I   Remaining deficits: Decreased R ankle DF strength   Education / Equipment: Handout for HEP and foot-up brace   Patient agrees to discharge. Patient goals were met. Patient is being discharged due to meeting the stated rehab goals.   END OF SESSION:  PT End of Session - 02/27/23 1622     Visit Number 5    Number of Visits 5    Date for PT Re-Evaluation 02/23/23    Authorization Type BCBS    PT Start Time 1422   pt arrived late   PT Stop Time 1440   d/c   PT Time Calculation (min) 18 min    Equipment Utilized During Treatment --    Activity Tolerance Patient tolerated treatment well    Behavior During Therapy WFL for tasks assessed/performed                Past Medical History:  Diagnosis Date   Allergy Entire life   Anxiety    Depression    Hyperlipidemia    Seizures (HCC)    Past Surgical History:  Procedure Laterality Date   CAROTID ENDARTERECTOMY Left 2022   HERNIA REPAIR     Patient Active Problem List   Diagnosis Date Noted   Environmental allergies 12/04/2022   Tobacco use 12/04/2022   Acquired right foot drop 11/08/2022   Generalized abdominal pain 03/29/2022   Bowler's thumb of right side 02/21/2022   Essential tremor 12/23/2021   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 12/23/2021   Anxiety and depression 12/23/2021   Carotid stenosis, asymptomatic, left 12/23/2021   Vitamin D deficiency 12/23/2021   Preventative health care 12/23/2021   Hyperlipidemia 12/23/2021   Other fatigue 12/23/2021   Overweight 12/23/2021   Facial droop 08/23/2020   Insomnia 05/21/2019    PCP: Eden Emms, NP  REFERRING PROVIDER: Anson Fret, MD  REFERRING DIAG: [phone] (ICD-10-CM) - Right foot drop G57.31 (ICD-10-CM) - Peroneal neuropathy at knee, right  THERAPY DIAG:  Muscle weakness (generalized)  Other abnormalities of gait and mobility  Foot drop, right  Rationale for Evaluation and Treatment: Rehabilitation  ONSET DATE: 01/11/2023 (referral date)  SUBJECTIVE:   SUBJECTIVE STATEMENT: Denies pain or acute changes since last visit. Pt reports decreased stiffness in his R foot.  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  Reports point tender pain moderate level along medial aspect of R foot, sharp when pressed otherwise achy, hurts more with pressure or walking, improves with rest  PRECAUTIONS: Fall  WEIGHT BEARING RESTRICTIONS: No  FALLS:  Has patient fallen in last 6 months? No but 3-4 close falls  LIVING ENVIRONMENT: Lives with: lives alone (daughter moving out soon) Lives in: House/apartment Stairs: Yes: External: 3 steps; on right going up Has following equipment at home: Dan Humphreys - 2 wheeled (hasn't needed it though)  OCCUPATION: desk job  PLOF: Independent with gait and Independent with transfers  PATIENT GOALS:  "to see if I can reverse any of  this", goal with Dr. Lucia Gaskins is to make sure it doesn't get worse  NEXT MD VISIT: 01/17/23 EMG with Dr. Lucia Gaskins at [facility]  OBJECTIVE:   DIAGNOSTIC FINDINGS:  EMG scheduled for 01/17/23  Lumbar MRI ordered but not scheduled (not really necessary as symptoms are related to peroneal nerve)  Lumbar Spine Xray 11/08/22 IMPRESSION: Normal alignment and no acute bony findings or significant degenerative changes.  EMG results 01/2023:  Conclusion: There is a right  sensorimotor peroneal neuropathy across the fibular head.  Chronic neurogenic changes were seen in distal peroneal muscles but no acute ongoing denervation.  No suggestion of polyneuropathy or lumbar radiculopathy.   TODAY'S TREATMENT:                                                                                                                                NMR/E-Stim Trialed Bioness (Tablet 2) to R ankle DF with R ankle cuff to assess gait with and without electrical stimulation. Pt does exhibit increased R ankle DF during gait with use of Bioness, however it actually impairs his gait mechanics as it does not allow rest periods and he exhibits improved gait and R heel strike without use of Bioness. Not recommending use of Bioness at this time.  TherAct For LTG assessment: LEFS: 45/80   PATIENT EDUCATION:  Education details: continue HEP Person educated: Patient Education method: Explanation Education comprehension: verbalized understanding  HOME EXERCISE PROGRAM: Access Code: [email or web address] URL: [email or web address] Date: 02/13/2023 Prepared by: Peter Congo  Exercises - Standing Heel Raises  - 1 x daily - 7 x weekly - 3 sets - 10 reps - Standing Toe Raises at Chair  - 1 x daily - 7 x weekly - 3 sets - 10 reps - Long Sitting Ankle Pumps  - 1 x daily - 7 x weekly - 2 sets - 15 reps - Seated Heel Toe Raises  - 1 x daily - 7 x weekly - 3 sets - 10 reps - Ankle and Toe Plantarflexion with Resistance  - 1 x daily - 7 x weekly - 3 sets - 10 reps  ASSESSMENT:  CLINICAL IMPRESSION: Emphasis of skilled PT session on reassessing LTG and trialing Bioness to see if it would improve his gait mechanics. Pt has met 3/3 LTG due to being independent with his HEP, has trialed various braces, and improved his score on the LEFS to 45/80, demonstrating decreased dysfunction. Pt to continue with his HEP independently and purchase a foot-up brace if needed. Pt agreeable to d/c from OPPT  services this date.    OBJECTIVE IMPAIRMENTS: Abnormal gait, decreased knowledge of condition, difficulty walking, decreased strength, impaired perceived functional ability, and impaired sensation.   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   REHAB POTENTIAL: Good  CLINICAL DECISION MAKING: Stable/uncomplicated  EVALUATION COMPLEXITY: Low   GOALS: Goals reviewed with patient? Yes  SHORT TERM GOALS=LONG TERM GOALS due to length of POC  LONG TERM GOALS: Target date: 02/23/2023  Pt will be independent with final HEP for improved strength, balance, transfers and gait. Baseline: not initiated at eval Goal status: MET  2.  Pt will improve his score on the LEFS to >/= 45/80 to demonstrate improved functional level and ability to return to recreational activities. Baseline: 36/80 (6/7), 45/80 (7/23) Goal status: MET  3.  Pt to trial various bracing options to determine best fit to assist with improving overall gait mechanics and decreasing fall risk. Baseline: none assessed at eval Goal status: MET      Peter Congo, PT, DPT, CSRS  02/27/2023, 4:46 PM

## 2023-03-28 ENCOUNTER — Encounter: Payer: Self-pay | Admitting: Emergency Medicine

## 2023-09-19 ENCOUNTER — Encounter: Payer: Self-pay | Admitting: Physical Therapy

## 2023-12-06 ENCOUNTER — Ambulatory Visit: Payer: BC Managed Care – PPO | Admitting: Nurse Practitioner

## 2023-12-06 ENCOUNTER — Encounter: Payer: Self-pay | Admitting: Nurse Practitioner

## 2023-12-06 VITALS — BP 136/88 | HR 75 | Temp 98.4°F | Ht 68.5 in | Wt 180.8 lb

## 2023-12-06 DIAGNOSIS — E7800 Pure hypercholesterolemia, unspecified: Secondary | ICD-10-CM

## 2023-12-06 DIAGNOSIS — G4700 Insomnia, unspecified: Secondary | ICD-10-CM

## 2023-12-06 DIAGNOSIS — Z720 Tobacco use: Secondary | ICD-10-CM

## 2023-12-06 DIAGNOSIS — E559 Vitamin D deficiency, unspecified: Secondary | ICD-10-CM | POA: Diagnosis not present

## 2023-12-06 DIAGNOSIS — Z23 Encounter for immunization: Secondary | ICD-10-CM | POA: Diagnosis not present

## 2023-12-06 DIAGNOSIS — Z131 Encounter for screening for diabetes mellitus: Secondary | ICD-10-CM | POA: Diagnosis not present

## 2023-12-06 DIAGNOSIS — Z0000 Encounter for general adult medical examination without abnormal findings: Secondary | ICD-10-CM

## 2023-12-06 DIAGNOSIS — Z122 Encounter for screening for malignant neoplasm of respiratory organs: Secondary | ICD-10-CM

## 2023-12-06 DIAGNOSIS — F419 Anxiety disorder, unspecified: Secondary | ICD-10-CM

## 2023-12-06 DIAGNOSIS — E663 Overweight: Secondary | ICD-10-CM

## 2023-12-06 DIAGNOSIS — F32A Depression, unspecified: Secondary | ICD-10-CM

## 2023-12-06 DIAGNOSIS — Z125 Encounter for screening for malignant neoplasm of prostate: Secondary | ICD-10-CM

## 2023-12-06 LAB — CBC
HCT: 42.4 % (ref 39.0–52.0)
Hemoglobin: 14.2 g/dL (ref 13.0–17.0)
MCHC: 33.4 g/dL (ref 30.0–36.0)
MCV: 89.9 fl (ref 78.0–100.0)
Platelets: 270 10*3/uL (ref 150.0–400.0)
RBC: 4.72 Mil/uL (ref 4.22–5.81)
RDW: 13.3 % (ref 11.5–15.5)
WBC: 7.9 10*3/uL (ref 4.0–10.5)

## 2023-12-06 LAB — COMPREHENSIVE METABOLIC PANEL WITH GFR
ALT: 13 U/L (ref 0–53)
AST: 14 U/L (ref 0–37)
Albumin: 4 g/dL (ref 3.5–5.2)
Alkaline Phosphatase: 56 U/L (ref 39–117)
BUN: 12 mg/dL (ref 6–23)
CO2: 29 meq/L (ref 19–32)
Calcium: 8.8 mg/dL (ref 8.4–10.5)
Chloride: 99 meq/L (ref 96–112)
Creatinine, Ser: 0.88 mg/dL (ref 0.40–1.50)
GFR: 92.25 mL/min (ref >60.00)
Glucose, Bld: 100 mg/dL — ABNORMAL HIGH (ref 70–99)
Potassium: 4.3 meq/L (ref 3.5–5.1)
Sodium: 134 meq/L — ABNORMAL LOW (ref 135–145)
Total Bilirubin: 0.7 mg/dL (ref 0.2–1.2)
Total Protein: 6.1 g/dL (ref 6.0–8.3)

## 2023-12-06 LAB — URINALYSIS, MICROSCOPIC ONLY

## 2023-12-06 LAB — LIPID PANEL
Cholesterol: 218 mg/dL — ABNORMAL HIGH (ref 0–200)
HDL: 60.7 mg/dL (ref >39.00)
LDL Cholesterol: 140 mg/dL — ABNORMAL HIGH (ref 0–99)
NonHDL: 157.76
Total CHOL/HDL Ratio: 4
Triglycerides: 90 mg/dL (ref 0.0–149.0)
VLDL: 18 mg/dL (ref 0.0–40.0)

## 2023-12-06 LAB — TSH: TSH: 3.51 u[IU]/mL (ref 0.35–5.50)

## 2023-12-06 LAB — VITAMIN D 25 HYDROXY (VIT D DEFICIENCY, FRACTURES): VITD: 9.54 ng/mL — ABNORMAL LOW (ref 30.00–100.00)

## 2023-12-06 LAB — HEMOGLOBIN A1C: Hgb A1c MFr Bld: 5.6 % (ref 4.6–6.5)

## 2023-12-06 LAB — PSA: PSA: 1.16 ng/mL (ref 0.10–4.00)

## 2023-12-06 NOTE — Assessment & Plan Note (Signed)
 History of same with Wellbutrin  and fluoxetine.  Patient been off both medications 1 for months 1 for a week and some change.  States he is doing okay currently.  Patient would like to continue off medication.  Did offer to have a follow-up in 1 to 3 months to make sure he is doing okay patient declined and states he can message via MyChart if he needs to go back on the medications

## 2023-12-06 NOTE — Assessment & Plan Note (Signed)
 History of the same, vitamin D  level today

## 2023-12-06 NOTE — Assessment & Plan Note (Signed)
 Pending urine microscopy rule out microscopic hematuria

## 2023-12-06 NOTE — Assessment & Plan Note (Signed)
 Pending TSH, A1c, lipid panel today.

## 2023-12-06 NOTE — Assessment & Plan Note (Signed)
 History of same.  Patient not been taking anticholesterol medication.  Pain with the manage today

## 2023-12-06 NOTE — Patient Instructions (Signed)
Nice to see you today I will be in touch with the labs once I have them Follow up with me in 1 year, sooner If you need me  

## 2023-12-06 NOTE — Assessment & Plan Note (Signed)
 Discussed age-appropriate immunization and screening exams.  Did review patient's personal, surgical, social, family histories.  Patient is up-to-date on all age-appropriate vaccinations he would like.  Update Prevnar 20 today.  Patient up-to-date on CRC screening, prostate cancer screening with a PSA today.  Patient was given information at discharge about preventative healthcare maintenance with anticipatory guidance.

## 2023-12-06 NOTE — Assessment & Plan Note (Signed)
 Was on Lunesta  3 mg nightly.  Has been off of it for some time seems to be sleeping okay stable at this juncture

## 2023-12-06 NOTE — Progress Notes (Signed)
 Established Patient Office Visit  Subjective   Patient ID: Barry Perez, male    DOB: 1961/01/06  Age: 63 y.o. MRN: [id number]  Chief Complaint  Patient presents with   Annual Exam    Discuss prevnar 20. Declines hiv screening.    Medication Management    Pt would like to discuss prozac and wellbutrin      HPI  HLD: states that he is suppose to be on atrovastatin but has not been taking it.  Patient unsure as to why not taking it states he had a conversation with the family member states they had a weird side effect of the medication.  Patient has a history of a carotid endarterectomy.  Insomnia: states that he would use Lunesta  when he had it but has been out a bit  MDD/GAD: states that he was on prozac and wellbutrin . States that he has been off wellbutrin  over a month. States that fluoxetine for approx a week.  Prior to discontinuing patient did wean self off with every other day dosing.  Patient feel has been okay right now.  He would like to see if he actually needs to go back on the medication.  Allerges: state that he is doing the xyzal  and singulair  unsure if it is helping but he thinks because medications well  for complete physical and follow up of chronic conditions.  Immunizations: -Tetanus: Completed in 2021 -Influenza: Out of season -Shingles: Completed Shingrix series -Pneumonia: Needs prevnar 20  Diet: Fair diet. 2 meals a day with some snacks. He is drinking coffee, water, soda.  Exercise: No regular exercise.  Eye exam: Needs updating. Reading glasses Dental exam: Completes semi-annually    Colonoscopy: Completed in 12/25/2017 Lung Cancer Screening: refer today   PSA: Due  Sleep: goes to bed aroun 10-12 and will get up around 7-8. Feels rested and hard to get out of bed. He does snore       Review of Systems  Constitutional:  Negative for chills and fever.  Respiratory:  Negative for shortness of breath.   Cardiovascular:  Negative for chest pain  and leg swelling.  Gastrointestinal:  Negative for abdominal pain, blood in stool, constipation, diarrhea, nausea and vomiting.       BM daily   Genitourinary:  Negative for dysuria and hematuria.  Neurological:  Positive for tingling (bilateral feet/cramping). Negative for headaches.  Psychiatric/Behavioral:  Negative for hallucinations and suicidal ideas.       Objective:     BP 136/88   Pulse 75   Temp 98.4 F (36.9 C) (Oral)   Ht 5' 8.5" (1.74 m)   Wt 180 lb 12.8 oz (82 kg)   SpO2 96%   BMI 27.09 kg/m  BP Readings from Last 3 Encounters:  12/06/23 136/88  01/17/23 (!) 151/95  01/11/23 (!) 88/57   Wt Readings from Last 3 Encounters:  12/06/23 180 lb 12.8 oz (82 kg)  01/17/23 170 lb (77.1 kg)  01/11/23 171 lb (77.6 kg)   SpO2 Readings from Last 3 Encounters:  12/06/23 96%  12/04/22 97%  11/08/22 95%      Physical Exam Vitals and nursing note reviewed.  Constitutional:      Appearance: Normal appearance.  HENT:     Right Ear: Tympanic membrane, ear canal and external ear normal.     Left Ear: Tympanic membrane, ear canal and external ear normal.     Mouth/Throat:     Mouth: Mucous membranes are moist.     Pharynx:  Oropharynx is clear.  Eyes:     Extraocular Movements: Extraocular movements intact.     Pupils: Pupils are equal, round, and reactive to light.  Cardiovascular:     Rate and Rhythm: Normal rate and regular rhythm.     Pulses: Normal pulses.     Heart sounds: Normal heart sounds.  Pulmonary:     Effort: Pulmonary effort is normal.     Breath sounds: Normal breath sounds.  Abdominal:     General: Bowel sounds are normal. There is no distension.     Palpations: There is no mass.     Tenderness: There is no abdominal tenderness.     Hernia: No hernia is present.  Genitourinary:    Comments: Deferred  Musculoskeletal:     Right lower leg: No edema.     Left lower leg: No edema.  Lymphadenopathy:     Cervical: No cervical adenopathy.   Skin:    General: Skin is warm.  Neurological:     General: No focal deficit present.     Mental Status: He is alert.     Deep Tendon Reflexes:     Reflex Scores:      Bicep reflexes are 2+ on the right side and 2+ on the left side.      Patellar reflexes are 2+ on the right side and 2+ on the left side.    Comments: Bilateral upper and lower extremity strength 5/5  Psychiatric:        Mood and Affect: Mood normal.        Behavior: Behavior normal.        Thought Content: Thought content normal.        Judgment: Judgment normal.      No results found for any visits on 12/06/23.    The ASCVD Risk score (Arnett DK, et al., 2019) failed to calculate for the following reasons:   Unable to determine if patient is Non-Hispanic African American    Assessment & Plan:   Problem List Items Addressed This Visit       Other   Insomnia   Was on Lunesta  3 mg nightly.  Has been off of it for some time seems to be sleeping okay stable at this juncture      Anxiety and depression   History of same with Wellbutrin  and fluoxetine.  Patient been off both medications 1 for months 1 for a week and some change.  States he is doing okay currently.  Patient would like to continue off medication.  Did offer to have a follow-up in 1 to 3 months to make sure he is doing okay patient declined and states he can message via MyChart if he needs to go back on the medications      Relevant Orders   Hemoglobin A1c   Vitamin D  deficiency   History of the same, vitamin D  level today      Relevant Orders   VITAMIN D  25 Hydroxy (Vit-D Deficiency, Fractures)   Preventative health care - Primary   Discussed age-appropriate immunization and screening exams.  Did review patient's personal, surgical, social, family histories.  Patient is up-to-date on all age-appropriate vaccinations he would like.  Update Prevnar 20 today.  Patient up-to-date on CRC screening, prostate cancer screening with a PSA today.   Patient was given information at discharge about preventative healthcare maintenance with anticipatory guidance.      Relevant Orders   CBC   Comprehensive metabolic panel with GFR  TSH   Hyperlipidemia   History of same.  Patient not been taking anticholesterol medication.  Pain with the manage today      Relevant Orders   Hemoglobin A1c   TSH   Lipid panel   Overweight   Pending TSH, A1c, lipid panel today      Relevant Orders   Hemoglobin A1c   TSH   Lipid panel   Tobacco use   Pending urine microscopy rule out microscopic hematuria      Relevant Orders   Urine Microscopic   Other Visit Diagnoses       Need for pneumococcal 20-valent conjugate vaccination       Relevant Orders   Pneumococcal conjugate vaccine 20-valent (Prevnar 20) (Completed)     Screening for prostate cancer       Relevant Orders   PSA     Screening for diabetes mellitus       Relevant Orders   Hemoglobin A1c     Screening for lung cancer       Relevant Orders   Ambulatory Referral Lung Cancer Screening [facility]       Return in about 1 year (around 12/05/2024) for CPE and Labs.    Margarie Shay, NP

## 2023-12-07 ENCOUNTER — Encounter: Payer: Self-pay | Admitting: Nurse Practitioner

## 2023-12-07 ENCOUNTER — Other Ambulatory Visit: Payer: Self-pay | Admitting: Nurse Practitioner

## 2023-12-07 DIAGNOSIS — E559 Vitamin D deficiency, unspecified: Secondary | ICD-10-CM

## 2023-12-07 DIAGNOSIS — E785 Hyperlipidemia, unspecified: Secondary | ICD-10-CM

## 2023-12-07 MED ORDER — VITAMIN D (ERGOCALCIFEROL) 1.25 MG (50000 UNIT) PO CAPS: 50000.0000 [IU] | ORAL_CAPSULE | ORAL | 0 refills | Status: DC

## 2023-12-07 MED ORDER — ATORVASTATIN CALCIUM 20 MG PO TABS: 20.0000 mg | ORAL_TABLET | Freq: Every day | ORAL | 3 refills | Status: AC

## 2023-12-12 ENCOUNTER — Other Ambulatory Visit: Payer: Self-pay | Admitting: Nurse Practitioner

## 2023-12-12 DIAGNOSIS — Z9109 Other allergy status, other than to drugs and biological substances: Secondary | ICD-10-CM

## 2023-12-20 ENCOUNTER — Telehealth: Payer: Self-pay | Admitting: Acute Care

## 2023-12-20 DIAGNOSIS — Z122 Encounter for screening for malignant neoplasm of respiratory organs: Secondary | ICD-10-CM

## 2023-12-20 DIAGNOSIS — Z87891 Personal history of nicotine dependence: Secondary | ICD-10-CM

## 2023-12-20 DIAGNOSIS — F17210 Nicotine dependence, cigarettes, uncomplicated: Secondary | ICD-10-CM

## 2023-12-20 NOTE — Telephone Encounter (Signed)
 Lung Cancer Screening Narrative/Criteria Questionnaire (Cigarette Smokers Only- No Cigars/Pipes/vapes)   Barry Perez   SDMV:01/21/2024 8:45 [name]     14-Mar-1961   LDCT: 01/22/2024 1030 [facility]    62 y.o.   Phone: [phone]  Lung Screening Narrative (confirm age 38-77 yrs Medicare / 50-80 yrs Private pay insurance)   Insurance information:BCBS   Referring Provider:James Tivis Forster, NP   This screening involves an initial phone call with a team member from our program. It is called a shared decision making visit. The initial meeting is required by  insurance and Medicare to make sure you understand the program. This appointment takes about 15-20 minutes to complete. You will complete the screening scan at your scheduled date/time.  This scan takes about 5-10 minutes to complete. You can eat and drink normally before and after the scan.  Criteria questions for Lung Cancer Screening:   Are you a current or former smoker? Current Age began smoking: 63yo   If you are a former smoker, what year did you quit smoking? Quit for 2 years then started again(within 15 yrs)   To calculate your smoking history, I need an accurate estimate of how many packs of cigarettes you smoked per day and for how many years. (Not just the number of PPD you are now smoking)   Years smoking 26 x Packs per day 1 = Pack years 26   (at least 20 pack yrs)   (Make sure they understand that we need to know how much they have smoked in the past, not just the number of PPD they are smoking now)  Do you have a personal history of cancer?  No    Do you have a family history of cancer? Yes  (cancer type and and relative) Father - melanoma  Are you coughing up blood?  No  Have you had unexplained weight loss of 15 lbs or more in the last 6 months? No  It looks like you meet all criteria.  When would be a good time for us  to schedule you for this screening?   Additional information: N/A

## 2024-01-02 ENCOUNTER — Other Ambulatory Visit: Payer: Self-pay | Admitting: Nurse Practitioner

## 2024-01-02 DIAGNOSIS — Z9109 Other allergy status, other than to drugs and biological substances: Secondary | ICD-10-CM

## 2024-01-21 ENCOUNTER — Ambulatory Visit (INDEPENDENT_AMBULATORY_CARE_PROVIDER_SITE_OTHER): Admitting: Adult Health

## 2024-01-21 ENCOUNTER — Encounter: Payer: Self-pay | Admitting: Adult Health

## 2024-01-21 ENCOUNTER — Telehealth: Payer: Self-pay | Admitting: Acute Care

## 2024-01-21 DIAGNOSIS — F17210 Nicotine dependence, cigarettes, uncomplicated: Secondary | ICD-10-CM | POA: Diagnosis not present

## 2024-01-21 IMAGING — MR MR [PERSON_NAME]*[PERSON_NAME]* W/O CM
5 series · 40 of 40 positions shown · non-contrast
Comparison: None Available.

CLINICAL DATA: Right MCP joint pain.  No prior surgery.

EXAM:
MRI OF THE RIGHT FINGERS WITHOUT CONTRAST
TECHNIQUE: Multiplanar, multisequence MR imaging of the right thumb was
performed. No intravenous contrast was administered.

[Series 4: t1_ax obl · axial · left · 3.0mm · 0.31mm/px · z∈[-81,+17]mm · 12 of 32 slices shown]
[im 1/32]
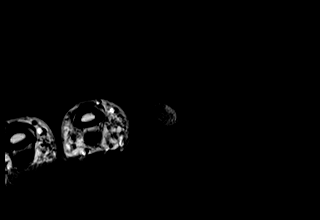
[im 3/32]
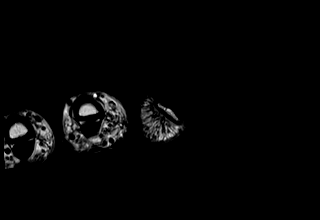
[im 6/32]
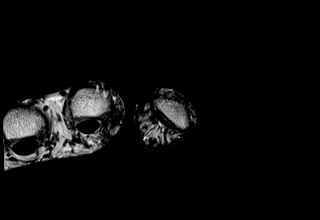
[im 9/32]
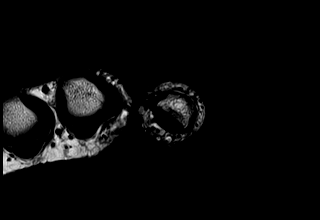
[im 12/32]
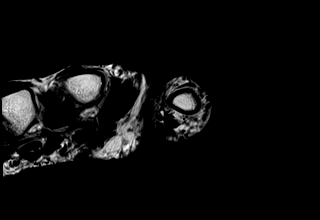
[im 15/32]
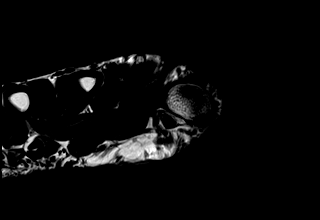
[im 17/32]
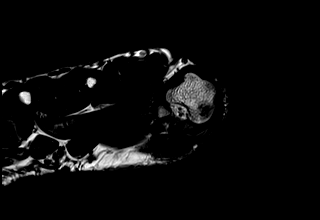
[im 20/32]
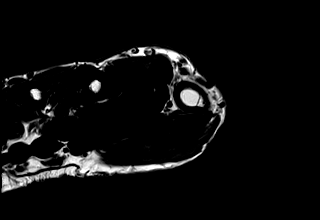
[im 23/32]
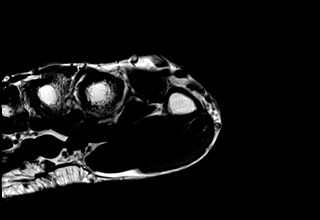
[im 26/32]
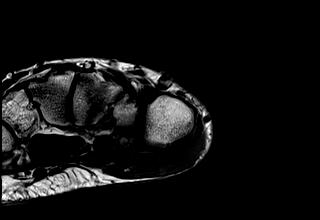
[im 29/32]
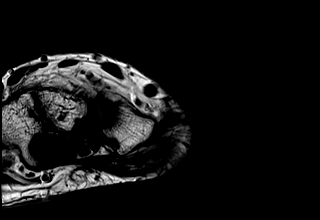
[im 32/32]
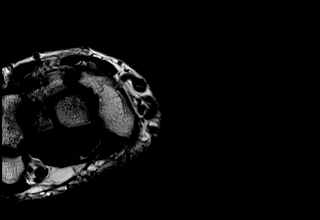

[Series 5: T2 fat-sat · axial · left · 3.0mm · 0.31mm/px · z∈[-81,+17]mm · 11 of 32 slices shown (1 of 2)]
[im 1/32]
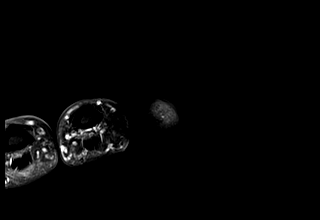
[im 4/32]
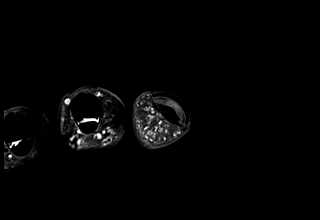
[im 7/32]
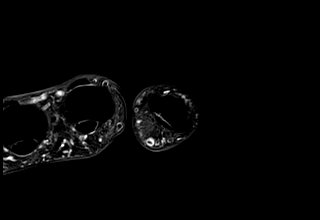
[im 10/32]
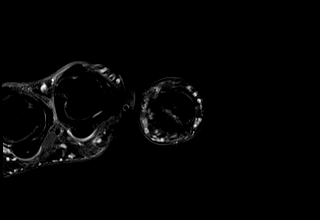
[im 13/32]
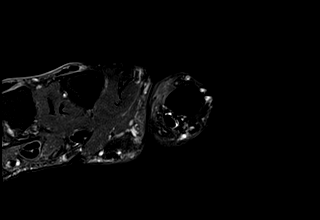
[im 16/32]
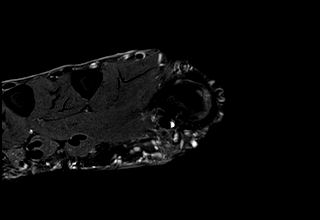
[im 19/32]
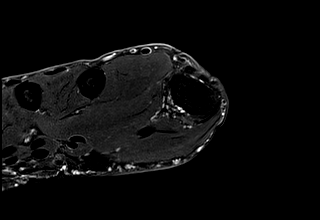
[im 22/32]
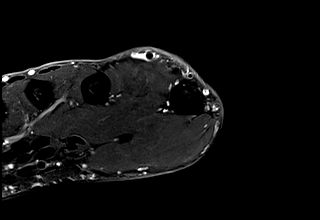
[im 25/32]
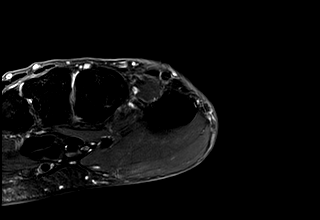
[im 28/32]
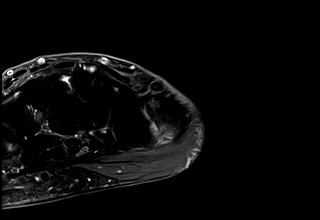
[im 32/32]
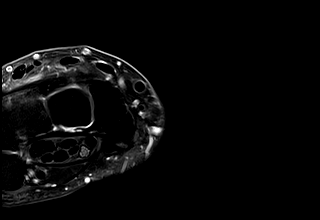

[Series 101: PD fat-sat · oblique · left · 2.0mm · 0.31mm/px · 5 of 14 slices shown (1 of 2)]
[im 1/14]
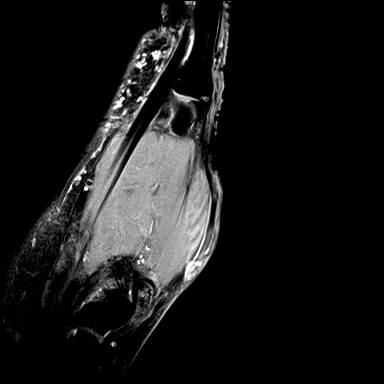
[im 4/14]
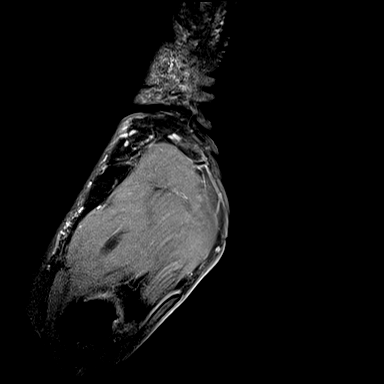
[im 7/14]
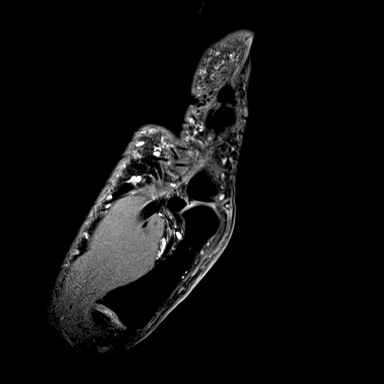
[im 10/14]
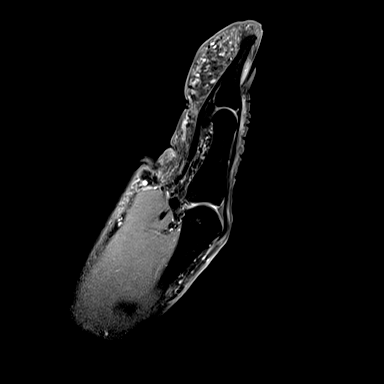
[im 14/14]
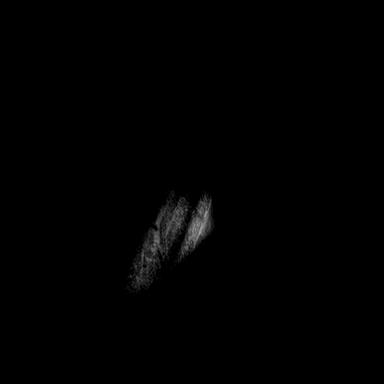

[Series 102: T2 fat-sat · oblique · left · 2.0mm · 0.23mm/px · 6 of 17 slices shown (2 of 2)]
[im 1/17]
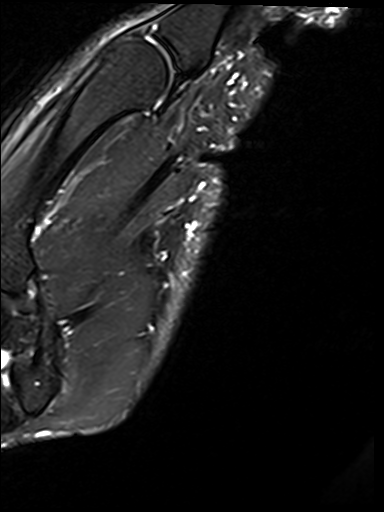
[im 4/17]
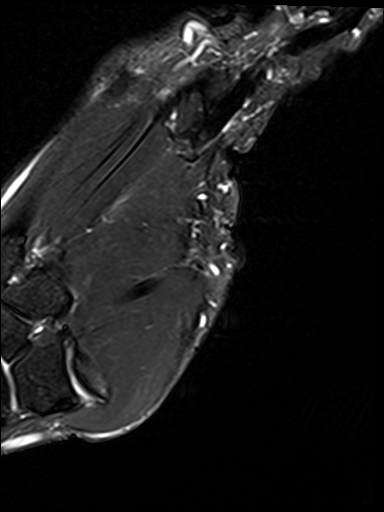
[im 7/17]
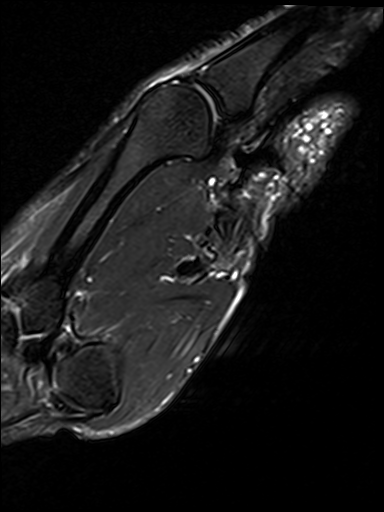
[im 10/17]
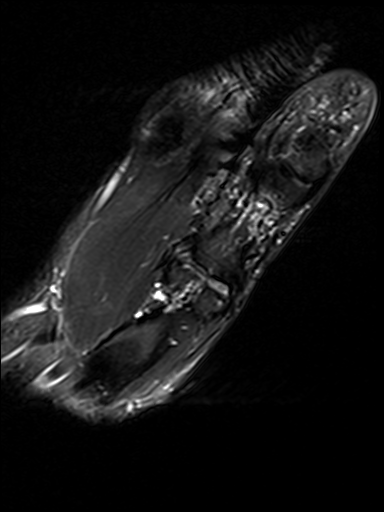
[im 13/17]
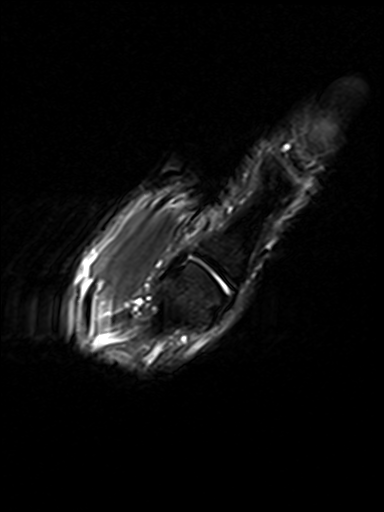
[im 17/17]
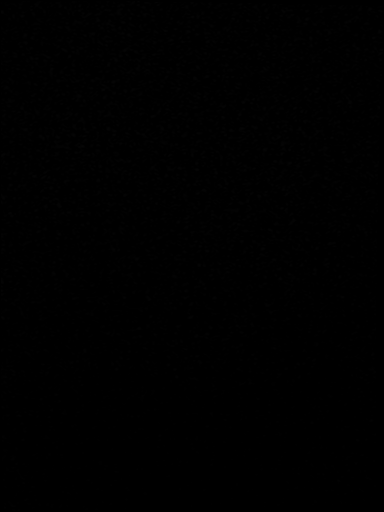

[Series 103: PD fat-sat · oblique · left · 2.0mm · 0.26mm/px · 6 of 17 slices shown (2 of 2)]
[im 1/17]
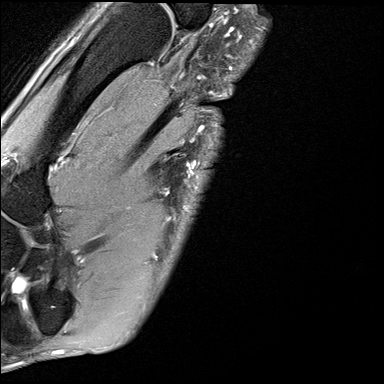
[im 4/17]
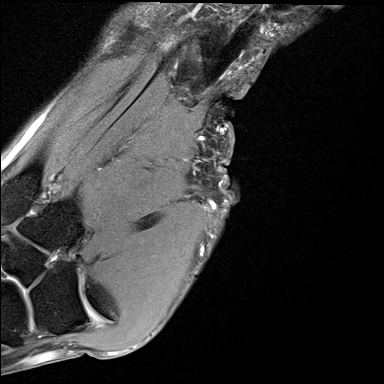
[im 7/17]
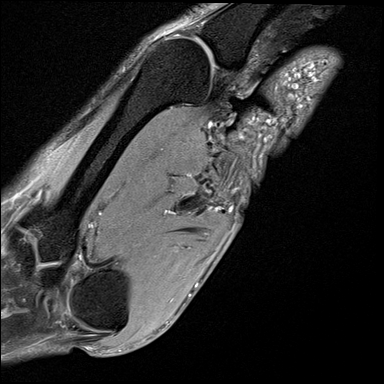
[im 10/17]
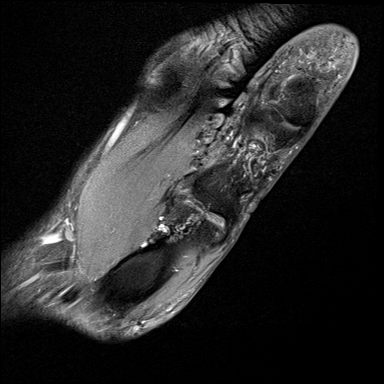
[im 13/17]
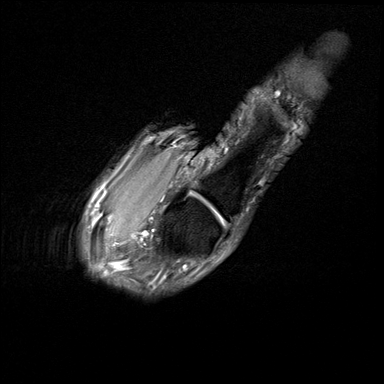
[im 17/17]
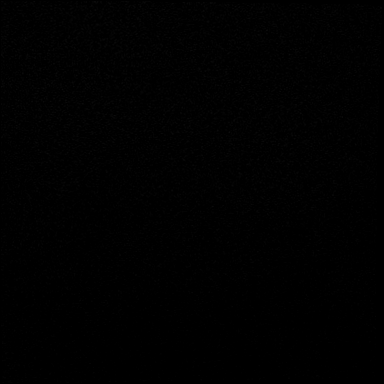

[40 of 40 positions shown; findings below may reference images not displayed]

FINDINGS: Bones/Joint/Cartilage

No fracture or dislocation. Normal alignment. No joint effusion. No
erosive changes. No periostitis. Partial-thickness cartilage loss of
the radial aspect of the first metacarpal head with subchondral
reactive marrow edema. Mild partial-thickness cartilage loss of the
first CMC joint.

Ligaments

Mild ligament strain at the phalangeal insertion of the ulnar
collateral ligament of the first MCP joint. Radial collateral
ligament of the first MCP joint is intact. Collateral ligaments of
the first IP joint are intact.

Muscles and Tendons
Flexor and extensor compartment tendons are intact. Muscles are
normal.

Soft tissue
No fluid collection or hematoma.  No soft tissue mass.
IMPRESSION: 1. Mild ligament strain at the phalangeal insertion of the ulnar
collateral ligament of the first MCP joint.
2. Partial-thickness cartilage loss of the radial aspect of the
first metacarpal head with subchondral reactive marrow edema.
3. Mild partial-thickness cartilage loss of the first CMC joint.

## 2024-01-21 NOTE — Progress Notes (Signed)
  Virtual Visit via Telephone Note  I connected with Barry Perez , 01/21/24 8:52 AM by a telemedicine application and verified that I am speaking with the correct person using two identifiers.  Location: Patient: home Provider: home   I discussed the limitations of evaluation and management by telemedicine and the availability of in person appointments. The patient expressed understanding and agreed to proceed.   Shared Decision Making Visit Lung Cancer Screening Program [phone])   Eligibility: 63 y.o. Pack Years Smoking History Calculation =26 pack years  (# packs/per year x # years smoked) Recent History of coughing up blood  no Unexplained weight loss? no ( >Than 15 pounds within the last 6 months ) Prior History Lung / other cancer no (Diagnosis within the last 5 years already requiring surveillance chest CT Scans). Smoking Status Current Smoker  Visit Components: Discussion included one or more decision making aids. YES Discussion included risk/benefits of screening. YES Discussion included potential follow up diagnostic testing for abnormal scans. YES Discussion included meaning and risk of over diagnosis. YES Discussion included meaning and risk of False Positives. YES Discussion included meaning of total radiation exposure. YES  Counseling Included: Importance of adherence to annual lung cancer LDCT screening. YES Impact of comorbidities on ability to participate in the program. YES Ability and willingness to under diagnostic treatment. YES  Smoking Cessation Counseling: Current Smokers:  Discussed importance of smoking cessation. yes Information about tobacco cessation classes and interventions provided to patient. yes Patient provided with ticket for LDCT Scan. yes Symptomatic Patient. NO Diagnosis Code: Tobacco Use Z72.0 Asymptomatic Patient yes  Counseling - 4 minutes of smoking cessation counseling (CT Chest Lung Cancer Screening Low Dose W/O CM)  [id number]  Z12.2-Screening of respiratory organs Z87.891-Personal history of nicotine dependence   Cullen Dose 01/21/24

## 2024-01-21 NOTE — Patient Instructions (Signed)

## 2024-01-21 NOTE — Telephone Encounter (Signed)
 PT was called to confirm the NO text response. PT stated he was not asked if he was ok with a video apt and did not want the apt. PT states he did not know what the apt was for.

## 2024-01-21 NOTE — Telephone Encounter (Signed)
 Visit was a SDMV not a virtual appt. Appt has been completed.

## 2024-01-22 ENCOUNTER — Ambulatory Visit
Admission: RE | Admit: 2024-01-22 | Discharge: 2024-01-22 | Disposition: A | Discharge status: Home / Self Care | Source: Ambulatory Visit | Attending: Acute Care | Admitting: Acute Care

## 2024-01-22 DIAGNOSIS — F17210 Nicotine dependence, cigarettes, uncomplicated: Secondary | ICD-10-CM | POA: Insufficient documentation

## 2024-01-22 DIAGNOSIS — Z122 Encounter for screening for malignant neoplasm of respiratory organs: Secondary | ICD-10-CM | POA: Diagnosis not present

## 2024-01-22 DIAGNOSIS — Z87891 Personal history of nicotine dependence: Secondary | ICD-10-CM | POA: Insufficient documentation

## 2024-02-04 ENCOUNTER — Telehealth: Payer: Self-pay | Admitting: *Deleted

## 2024-02-04 NOTE — Telephone Encounter (Signed)
 Call report from [facility]:  IMPRESSION: 1. Lung-RADS 4A, suspicious. Follow up low-dose chest CT without contrast in 3 months (please use the following order, CT CHEST LCS NODULE FOLLOW-UP W/O CM) is recommended. Indistinct 8.0 mm apical right upper lobe pulmonary nodule. 2. One vessel coronary atherosclerosis. 3.  Emphysema (ICD10-J43.9).

## 2024-02-13 NOTE — Telephone Encounter (Signed)
 Results have been reviewed by Ruthell, NP. She is in agreement with radiologist to repeat scan in 3 months to evaluate an indistinct 8.0 mm nodule. Please call patient and review results. Place f/u order. Send results and plan to PCP.

## 2024-02-14 NOTE — Telephone Encounter (Signed)
 LVM to call office and review results.This was the patient's 1st LCS. Indistinct 8.0 mm apical right upper lobe pulmonary nodule needs 3 month f/u scan.

## 2024-02-15 ENCOUNTER — Other Ambulatory Visit: Payer: Self-pay

## 2024-02-15 DIAGNOSIS — Z87891 Personal history of nicotine dependence: Secondary | ICD-10-CM

## 2024-02-15 DIAGNOSIS — F17210 Nicotine dependence, cigarettes, uncomplicated: Secondary | ICD-10-CM

## 2024-02-15 DIAGNOSIS — R911 Solitary pulmonary nodule: Secondary | ICD-10-CM

## 2024-02-15 DIAGNOSIS — Z122 Encounter for screening for malignant neoplasm of respiratory organs: Secondary | ICD-10-CM

## 2024-02-28 ENCOUNTER — Other Ambulatory Visit: Payer: Self-pay | Admitting: Nurse Practitioner

## 2024-02-28 DIAGNOSIS — E559 Vitamin D deficiency, unspecified: Secondary | ICD-10-CM

## 2024-04-29 ENCOUNTER — Ambulatory Visit

## 2024-04-29 ENCOUNTER — Ambulatory Visit: Admission: RE | Admit: 2024-04-29 | Source: Ambulatory Visit

## 2024-05-20 ENCOUNTER — Other Ambulatory Visit: Payer: Self-pay | Admitting: Nurse Practitioner

## 2024-05-20 ENCOUNTER — Encounter: Payer: Self-pay | Admitting: Nurse Practitioner

## 2024-05-20 DIAGNOSIS — F32A Depression, unspecified: Secondary | ICD-10-CM

## 2024-05-20 DIAGNOSIS — E559 Vitamin D deficiency, unspecified: Secondary | ICD-10-CM

## 2024-05-22 MED ORDER — FLUOXETINE HCL 20 MG PO CAPS: 20.0000 mg | ORAL_CAPSULE | Freq: Every day | ORAL | 0 refills | Status: DC

## 2024-05-22 MED ORDER — BUPROPION HCL ER (XL) 150 MG PO TB24: 150.0000 mg | ORAL_TABLET | Freq: Every day | ORAL | 0 refills | Status: DC

## 2024-05-22 NOTE — Addendum Note (Signed)
 Addended by: WENDEE LYNWOOD HERO on: 05/22/2024 03:39 PM   Modules accepted: Orders

## 2024-05-22 NOTE — Telephone Encounter (Signed)
 See my chart message in regards to LDCT  Also is it appropriate to message you for this or is there a point person

## 2024-06-04 ENCOUNTER — Ambulatory Visit (INDEPENDENT_AMBULATORY_CARE_PROVIDER_SITE_OTHER)
Admission: RE | Admit: 2024-06-04 | Discharge: 2024-06-04 | Disposition: A | Discharge status: Home / Self Care | Source: Ambulatory Visit | Attending: Nurse Practitioner | Admitting: Nurse Practitioner

## 2024-06-04 ENCOUNTER — Ambulatory Visit
Admission: RE | Admit: 2024-06-04 | Discharge: 2024-06-04 | Disposition: A | Source: Ambulatory Visit | Attending: Nurse Practitioner

## 2024-06-04 ENCOUNTER — Ambulatory Visit: Admitting: Nurse Practitioner

## 2024-06-04 VITALS — BP 110/76 | HR 94 | Temp 98.2°F | Ht 68.5 in | Wt 168.0 lb

## 2024-06-04 DIAGNOSIS — W19XXXA Unspecified fall, initial encounter: Secondary | ICD-10-CM

## 2024-06-04 DIAGNOSIS — R600 Localized edema: Secondary | ICD-10-CM | POA: Diagnosis not present

## 2024-06-04 DIAGNOSIS — M79672 Pain in left foot: Secondary | ICD-10-CM | POA: Diagnosis not present

## 2024-06-04 DIAGNOSIS — S82892A Other fracture of left lower leg, initial encounter for closed fracture: Secondary | ICD-10-CM | POA: Diagnosis not present

## 2024-06-04 DIAGNOSIS — M545 Low back pain, unspecified: Secondary | ICD-10-CM

## 2024-06-04 DIAGNOSIS — M25572 Pain in left ankle and joints of left foot: Secondary | ICD-10-CM | POA: Diagnosis not present

## 2024-06-04 DIAGNOSIS — M19072 Primary osteoarthritis, left ankle and foot: Secondary | ICD-10-CM | POA: Diagnosis not present

## 2024-06-04 NOTE — Patient Instructions (Signed)
 Nice to see you today  I will be in touch with the xrays once I have reviewed them

## 2024-06-04 NOTE — Progress Notes (Signed)
 Established Patient Office Visit  Subjective   Patient ID: Barry Perez, male    DOB: February 22, 1961  Age: 63 y.o. MRN: [id number]  Chief Complaint  Patient presents with   Back Pain    Pt complains of lower back pain near tailbone  2 weeks from falling in the shower.    Ankle Pain    Pt complains of L ankle pain due to falling in shower. States of swelling     Discussed the use of AI scribe software for clinical note transcription with the patient, who gave verbal consent to proceed.  History of Present Illness Barry Perez is a 63 year old male who presents with persistent pain and swelling following a fall in the shower two weeks ago.  Two weeks ago, he fell in the shower, slipping and hitting the wall with his feet. He attempted to brace himself with his side but ended up sliding down onto a seating area, impacting his back on the corner of the seat. Since the fall, he has experienced persistent swelling in his left ankle, which is visibly larger than the right. The swelling has not decreased over the two-week period. The ankle is tender to touch and movement, but not painful when sitting still. He is able to walk, albeit with some discomfort. No numbness, tingling, or weakness in his legs, and normal bowel and bladder function are reported.  He describes a 'floating' hard and thick mass in his right butt cheek, which he can locate and pinch. His vertebrae are described as 'really agitated' and tender to touch, particularly on the right side, but not in the center.  He has tried Tylenol, ibuprofen, heat, and ice, which have helped to the extent that he can walk, but have not resolved the symptoms. No pain or discomfort is noted when pressure is applied to the bottom of his foot, but there is tightness when moving the ankle side to side. He recalls the ankle possibly turning in during the fall, but the event happened too quickly to be certain.     Review of Systems   Constitutional:  Negative for chills and fever.  Respiratory:  Negative for shortness of breath.   Cardiovascular:  Negative for chest pain.  Musculoskeletal:  Positive for back pain.  Neurological:  Negative for tingling, weakness and headaches.  Psychiatric/Behavioral:  Negative for hallucinations and suicidal ideas.       Objective:     BP 110/76   Pulse 94   Temp 98.2 F (36.8 C) (Oral)   Ht 5' 8.5 (1.74 m)   Wt 168 lb (76.2 kg)   SpO2 97%   BMI 25.17 kg/m    Physical Exam Vitals and nursing note reviewed.  Constitutional:      Appearance: Normal appearance.  Cardiovascular:     Rate and Rhythm: Normal rate and regular rhythm.     Heart sounds: Normal heart sounds.  Pulmonary:     Effort: Pulmonary effort is normal.     Breath sounds: Normal breath sounds.  Musculoskeletal:        General: Tenderness present.     Lumbar back: Tenderness and bony tenderness present. Negative right straight leg raise test and negative left straight leg raise test.       Back:       Feet:  Feet:     Comments: Left ankle has Full ROM  Neurological:     General: No focal deficit present.     Mental Status:  He is alert.     Deep Tendon Reflexes:     Reflex Scores:      Patellar reflexes are 2+ on the right side and 2+ on the left side.    Comments: Bilateral lower extremity 5/5      No results found for any visits on 06/04/24.    The ASCVD Risk score (Arnett DK, et al., 2019) failed to calculate for the following reasons:   Unable to determine if patient is Non-Hispanic African American    Assessment & Plan:   Problem List Items Addressed This Visit   None Visit Diagnoses       Fall, initial encounter    -  Primary   Relevant Orders   DG Ankle Complete Left   DG Foot Complete Left   DG Lumbar Spine Complete      Assessment and Plan Assessment & Plan Left ankle sprain with swelling and tenderness after fall Persistent swelling and tenderness two weeks  post-fall. Differential includes possible fracture of the metatarsal or other bones in the foot or ankle. - Order x-ray of the left ankle and foot to assess for fracture. - Continue symptomatic treatment with Tylenol and ibuprofen as needed for pain.  Low back strain with right sacroiliac joint tenderness after fall Tenderness in the right sacroiliac joint area following a fall. Differential includes muscular strain or involvement of the SI joint. - Order x-ray of the back to assess for any structural damage. - Continue symptomatic treatment with Tylenol and ibuprofen as needed for pain.  Suspected soft tissue cyst of buttock Suspected soft tissue cyst in the buttock area, possibly a cyst or other soft tissue lesion. Differential includes a cyst or other soft tissue abnormality. - Order x-ray of the back to further evaluate the area. - Continue symptomatic treatment with Tylenol and ibuprofen as needed for pain.  Return in about 7 months (around 01/02/2025) for CPE and Labs.    Adina Crandall, NP

## 2024-06-05 ENCOUNTER — Ambulatory Visit: Payer: Self-pay | Admitting: Nurse Practitioner

## 2024-06-05 DIAGNOSIS — M25572 Pain in left ankle and joints of left foot: Secondary | ICD-10-CM | POA: Insufficient documentation

## 2024-06-05 DIAGNOSIS — M79672 Pain in left foot: Secondary | ICD-10-CM | POA: Insufficient documentation

## 2024-06-05 DIAGNOSIS — M545 Low back pain, unspecified: Secondary | ICD-10-CM | POA: Insufficient documentation

## 2024-06-05 NOTE — Telephone Encounter (Signed)
 Can we call about the left foot and ankle and get the radiologist to addend. At the top is does not say NO acute fracture, it just says acute fracture  In the impression it says no acute fracture

## 2024-08-17 ENCOUNTER — Other Ambulatory Visit: Payer: Self-pay | Admitting: Nurse Practitioner

## 2024-08-22 ENCOUNTER — Other Ambulatory Visit: Payer: Self-pay | Admitting: Nurse Practitioner

## 2024-08-22 DIAGNOSIS — F419 Anxiety disorder, unspecified: Secondary | ICD-10-CM
